# Patient Record
Sex: Female | Born: 1960
Health system: Southern US, Community
[De-identification: ages and names within clinical notes are randomized; demographics above are authoritative.]

## PROBLEM LIST (undated history)

## (undated) DIAGNOSIS — J45909 Unspecified asthma, uncomplicated: Secondary | ICD-10-CM

## (undated) DIAGNOSIS — R51 Headache: Secondary | ICD-10-CM

## (undated) DIAGNOSIS — I10 Essential (primary) hypertension: Secondary | ICD-10-CM

## (undated) DIAGNOSIS — K219 Gastro-esophageal reflux disease without esophagitis: Secondary | ICD-10-CM

## (undated) DIAGNOSIS — R519 Headache, unspecified: Secondary | ICD-10-CM

## (undated) HISTORY — DX: Unspecified asthma, uncomplicated: J45.909

## (undated) HISTORY — DX: Essential (primary) hypertension: I10

## (undated) HISTORY — DX: Gastro-esophageal reflux disease without esophagitis: K21.9

---

## 2009-08-05 ENCOUNTER — Emergency Department (HOSPITAL_COMMUNITY): Admission: EM | Admit: 2009-08-05 | Discharge: 2009-08-05 | Payer: Self-pay | Admitting: Family Medicine

## 2010-02-16 ENCOUNTER — Encounter: Admission: RE | Admit: 2010-02-16 | Discharge: 2010-02-16 | Payer: Self-pay | Admitting: Gastroenterology

## 2011-01-27 LAB — COMPREHENSIVE METABOLIC PANEL
Albumin: 4.2 g/dL (ref 3.5–5.2)
BUN: 6 mg/dL (ref 6–23)
Calcium: 9.7 mg/dL (ref 8.4–10.5)
Creatinine, Ser: 0.55 mg/dL (ref 0.4–1.2)
GFR calc Af Amer: >60 mL/min (ref >60)
Potassium: 4.1 mEq/L (ref 3.5–5.1)
Sodium: 139 mEq/L (ref 135–145)
Total Bilirubin: 0.4 mg/dL (ref 0.3–1.2)
Total Protein: 8.2 g/dL (ref 6.0–8.3)

## 2011-01-27 LAB — DIFFERENTIAL
Basophils Absolute: 0 10*3/uL (ref 0.0–0.1)
Monocytes Absolute: 0.6 10*3/uL (ref 0.1–1.0)
Monocytes Relative: 10 % (ref 3–12)
Neutrophils Relative %: 52 % (ref 43–77)

## 2011-01-27 LAB — POCT URINALYSIS DIP (DEVICE)
Ketones, ur: NEGATIVE mg/dL
Urobilinogen, UA: 0.2 mg/dL (ref 0.0–1.0)
pH: 6 (ref 5.0–8.0)

## 2011-01-27 LAB — CBC
MCHC: 34.7 g/dL (ref 30.0–36.0)
Platelets: 250 10*3/uL (ref 150–400)
RDW: 13.3 % (ref 11.5–15.5)

## 2011-01-27 LAB — POCT H PYLORI SCREEN: H. PYLORI SCREEN, POC: NEGATIVE

## 2011-01-27 LAB — POCT PREGNANCY, URINE: Preg Test, Ur: NEGATIVE

## 2012-05-05 ENCOUNTER — Emergency Department (HOSPITAL_COMMUNITY)
Admission: EM | Admit: 2012-05-05 | Discharge: 2012-05-05 | Disposition: A | Discharge status: Home / Self Care | Payer: BC Managed Care – PPO | Source: Home / Self Care | Attending: Emergency Medicine | Admitting: Emergency Medicine

## 2012-05-05 ENCOUNTER — Encounter (HOSPITAL_COMMUNITY): Payer: Self-pay | Admitting: Emergency Medicine

## 2012-05-05 DIAGNOSIS — R42 Dizziness and giddiness: Secondary | ICD-10-CM

## 2012-05-05 DIAGNOSIS — R51 Headache: Secondary | ICD-10-CM

## 2012-05-05 LAB — POCT I-STAT, CHEM 8
Calcium, Ion: 1.17 mmol/L (ref 1.12–1.23)
Chloride: 107 mEq/L (ref 96–112)
Glucose, Bld: 102 mg/dL — ABNORMAL HIGH (ref 70–99)
HCT: 47 % — ABNORMAL HIGH (ref 36.0–46.0)
Hemoglobin: 16 g/dL — ABNORMAL HIGH (ref 12.0–15.0)
Potassium: 4.3 mEq/L (ref 3.5–5.1)
Sodium: 142 mEq/L (ref 135–145)

## 2012-05-05 MED ORDER — ACETAMINOPHEN-CODEINE #3 300-30 MG PO TABS: 1.0000 | ORAL_TABLET | Freq: Four times a day (QID) | ORAL | Status: AC | PRN

## 2012-05-05 MED ORDER — ONDANSETRON 4 MG PO TBDP
4.0000 mg | ORAL_TABLET | Freq: Once | ORAL | Status: AC
  Administered 2012-05-05: 4 mg via ORAL

## 2012-05-05 MED ORDER — HYDROCODONE-ACETAMINOPHEN 5-325 MG PO TABS
1.0000 | ORAL_TABLET | Freq: Once | ORAL | Status: AC
  Administered 2012-05-05: 1 via ORAL

## 2012-05-05 MED ORDER — ONDANSETRON 4 MG PO TBDP
ORAL_TABLET | ORAL | Status: AC
  Filled 2012-05-05: qty 1

## 2012-05-05 MED ORDER — HYDROCODONE-ACETAMINOPHEN 5-325 MG PO TABS
ORAL_TABLET | ORAL | Status: AC
  Filled 2012-05-05: qty 1

## 2012-05-05 MED ORDER — ONDANSETRON HCL 4 MG PO TABS: 4.0000 mg | ORAL_TABLET | Freq: Four times a day (QID) | ORAL | Status: AC

## 2012-05-05 NOTE — ED Provider Notes (Signed)
History     CSN: [id number]  Arrival date & time 05/05/12  1236   First MD Initiated Contact with Patient 05/05/12 1243      Chief Complaint  Patient presents with  . Headache    (Consider location/radiation/quality/duration/timing/severity/associated sxs/prior treatment) HPI Comments: Patient presents urgent care today complaining of ongoing headache and dizziness, is not constant but they keep coming back. Sometimes she feels nauseous with the headaches and also feels that sometimes if she moves she gets more dizzy, or when she stands up. No vomiting, no fevers, no further symptoms and no constitutional symptoms such as fevers, arthralgias, myalgias, or unintentional weight loss. Patient also denies when asked about any numbness, tingling sensation weakness of upper lower extremities, visual changes, no ambulation or disequilibrium changes. Or facial expression changes. Patient is originally from did not and a relative is accompanying her to today's visit translating.  [place] 8 [place] denies having seen any provider is in the area or having a primary care doctor at this point.  Patient is a 51 y.o. female presenting with headaches. The history is provided by the patient. No language interpreter was used.  Headache The primary symptoms include headaches, dizziness and nausea. Primary symptoms do not include syncope, loss of consciousness, altered mental status, visual change, paresthesias, focal weakness, loss of sensation, speech change, memory loss, fever or vomiting. The symptoms began more than 1 week ago. The symptoms are worsening.  The headache is associated with photophobia. The headache is not associated with visual change, neck stiffness, paresthesias or weakness.  Dizziness also occurs with nausea. Dizziness does not occur with tinnitus, vomiting or weakness.  Additional symptoms include pain and photophobia. Additional symptoms do not  include neck stiffness, weakness or tinnitus.    History reviewed. No pertinent past medical history.  History reviewed. No pertinent past surgical history.  No family history on file.  History  Substance Use Topics  . Smoking status: Never Smoker   . Smokeless tobacco: Not on file  . Alcohol Use: No    OB History    Grav Para Term Preterm Abortions TAB SAB Ect Mult Living                  Review of Systems  Constitutional: Negative for fever, activity change and appetite change.  HENT: Negative for neck stiffness and tinnitus.   Eyes: Positive for photophobia.  Respiratory: Negative for cough and shortness of breath.   Cardiovascular: Negative for chest pain and syncope.  Gastrointestinal: Positive for nausea. Negative for vomiting and abdominal pain.  Skin: Negative for pallor and rash.  Neurological: Positive for dizziness and headaches. Negative for speech change, focal weakness, loss of consciousness, facial asymmetry, weakness, light-headedness, numbness and paresthesias.  Psychiatric/Behavioral: Negative for memory loss and altered mental status.    Allergies  Review of patient's allergies indicates no known allergies.  Home Medications   Current Outpatient Rx  Name Route Sig Dispense Refill  . OVER THE COUNTER MEDICATION  Extra strength migraine medication      BP 135/89  Pulse 60  Temp 98 F (36.7 C) (Oral)  Resp 18  SpO2 98%  LMP 04/23/2012  Physical Exam  Nursing note and vitals reviewed. Constitutional: She is oriented to person, place, and time. She appears well-developed and well-nourished.  HENT:  Head: Normocephalic.  Eyes: Conjunctivae and EOM are normal. Pupils are equal, round, and reactive to light.  Neck: Neck supple. No JVD  present. No Brudzinski's sign and no Kernig's sign noted.  Cardiovascular: Normal rate.   Pulmonary/Chest: Effort normal and breath sounds normal.  Neurological: She is alert and oriented to person, place, and  time. She displays no atrophy, no tremor and normal reflexes. No cranial nerve deficit or sensory deficit. She exhibits normal muscle tone. She displays a negative Romberg sign. Coordination and gait normal.  Skin: Skin is warm. No erythema.    ED Course  Procedures (including critical care time)  Labs Reviewed  POCT I-STAT, CHEM 8 - Abnormal; Notable for the following:    Glucose, Bld 102 (*)     Hemoglobin 16.0 (*)     HCT 47.0 (*)     All other components within normal limits   No results found.   No diagnosis found.    MDM  Recurrent HA and dizziness x 1 month. Normal neurological exam- Patient reports nausea with HA (intermitent), takes something OTC         Jimmie Molly, MD 05/05/12 1442

## 2012-05-05 NOTE — ED Notes (Signed)
Headache and dizziness, onset 7/12.  Reports dizziness regardless of position.  Nausea this am, no vomiting

## 2012-06-21 ENCOUNTER — Ambulatory Visit (INDEPENDENT_AMBULATORY_CARE_PROVIDER_SITE_OTHER): Payer: BC Managed Care – PPO | Admitting: Internal Medicine

## 2012-06-21 ENCOUNTER — Encounter: Payer: Self-pay | Admitting: Internal Medicine

## 2012-06-21 VITALS — BP 124/82 | HR 68 | Temp 98.0°F | Ht 60.5 in | Wt 140.0 lb

## 2012-06-21 DIAGNOSIS — R519 Headache, unspecified: Secondary | ICD-10-CM | POA: Insufficient documentation

## 2012-06-21 DIAGNOSIS — R1013 Epigastric pain: Secondary | ICD-10-CM

## 2012-06-21 DIAGNOSIS — Z0000 Encounter for general adult medical examination without abnormal findings: Secondary | ICD-10-CM

## 2012-06-21 DIAGNOSIS — R42 Dizziness and giddiness: Secondary | ICD-10-CM

## 2012-06-21 DIAGNOSIS — R51 Headache: Secondary | ICD-10-CM

## 2012-06-21 LAB — CBC WITH DIFFERENTIAL/PLATELET
Basophils Relative: 0.4 % (ref 0.0–3.0)
Eosinophils Absolute: 0.2 10*3/uL (ref 0.0–0.7)
Eosinophils Relative: 1.9 % (ref 0.0–5.0)
HCT: 44.5 % (ref 36.0–46.0)
Lymphs Abs: 2.9 10*3/uL (ref 0.7–4.0)
MCV: 82.8 fl (ref 78.0–100.0)
Platelets: 247 10*3/uL (ref 150.0–400.0)
RBC: 5.37 Mil/uL — ABNORMAL HIGH (ref 3.87–5.11)
RDW: 13.3 % (ref 11.5–14.6)

## 2012-06-21 LAB — BASIC METABOLIC PANEL
BUN: 10 mg/dL (ref 6–23)
CO2: 24 mEq/L (ref 19–32)
Calcium: 9.2 mg/dL (ref 8.4–10.5)
Potassium: 3.9 mEq/L (ref 3.5–5.1)

## 2012-06-21 LAB — LIPID PANEL: Triglycerides: 105 mg/dL (ref 0.0–149.0)

## 2012-06-21 LAB — TSH: TSH: 0.77 u[IU]/mL (ref 0.35–5.50)

## 2012-06-21 LAB — HEPATIC FUNCTION PANEL
AST: 27 U/L (ref 0–37)
Total Protein: 8 g/dL (ref 6.0–8.3)

## 2012-06-21 LAB — LDL CHOLESTEROL, DIRECT: Direct LDL: 146.7 mg/dL

## 2012-06-21 MED ORDER — OMEPRAZOLE 20 MG PO CPDR: 20.0000 mg | DELAYED_RELEASE_CAPSULE | Freq: Every day | ORAL | Status: DC

## 2012-06-21 MED ORDER — TRAMADOL HCL 50 MG PO TABS: 50.0000 mg | ORAL_TABLET | Freq: Two times a day (BID) | ORAL | Status: AC | PRN

## 2012-06-21 NOTE — Assessment & Plan Note (Signed)
She has intermittent epigastric pain. Patient reports remote EGD. She has no weight loss or dysphagia. Start Prilosec 20 mg once daily. If persistent symptoms consider referral to GI for repeat EGD.

## 2012-06-21 NOTE — Progress Notes (Signed)
Subjective:    Patient ID: Denise Blackburn, female    DOB: February 07, 1961, 51 y.o.   MRN: [id number]  HPI  51 year old [place] female to establish. Patient complains of chronic severe headaches for the last 5 months. Patient does not speak [place]. Her daughter Denise Blackburn is acting as Nurse, learning disability.  Patient describes the pain predominantly in the left side of her head. She has associated dizziness and intermittent vomiting. She rates severity of pain is 9/10. Her symptoms are worse when she lays down. She has previously been to urgent care and prescribed pain medications. She has also taken some over-the-counter pain medications with some improvement but her headaches always come back.  She denies any ear fullness or hearing loss. She occasionally has watery eyes but no changes in her vision. Patient reports occasionally her whole body will feel numb.  Patient's only medical history is of abdominal pain in the past. She was seen by a physician when she lived in [place]. Her daughter reports an endoscopy was performed 17 years ago. She does not recall results. She has intermittent epigastric pain and bitter taste in her mouth. She denies significant weight change. She denies dysphasia.  Review of Systems   Constitutional: Negative for activity change, appetite change and unexpected weight change.  Eyes: Negative for visual disturbance.  Respiratory: Negative for cough, chest tightness and shortness of breath.   Cardiovascular: Negative for chest pain.  Genitourinary: Negative for difficulty urinating.  Neurological: See HIP  Gastrointestinal: occasional epigastric abdominal pain Psych: Negative for depression or anxiety  Past Medical History  Diagnosis Date  . Asthma     7 yrs ago.  She does not use any inhalers  . GERD (gastroesophageal reflux disease)     EGD performed 15-17 yrs ago in [place]    History   Social History  . Marital Status: Married    Spouse Name: N/A    Number of Children:  N/A  . Years of Education: N/A   Occupational History  . Not on file.   Social History Main Topics  . Smoking status: Never Smoker   . Smokeless tobacco: Not on file  . Alcohol Use: No  . Drug Use: No  . Sexually Active:    Other Topics Concern  . 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  No family history on file.  No Known Allergies  Current Outpatient Prescriptions on File Prior to Visit  Medication Sig Dispense Refill  . omeprazole (PRILOSEC) 20 MG capsule Take 1 capsule (20 mg total) by mouth daily.  30 capsule  3    BP 124/82  Pulse 68  Temp 98 F (36.7 C) (Oral)  Ht 5' 0.5" (1.537 m)  Wt 140 lb (63.504 kg)  BMI 26.89 kg/m2     Objective:   Physical Exam  Constitutional: She is oriented to person, place, and time. She appears well-developed and well-nourished. No distress.  HENT:  Head: Normocephalic and atraumatic.  Right Ear: External ear normal.  Left Ear: External ear normal.  Mouth/Throat: Oropharynx is clear and moist.  Eyes: EOM are normal. Pupils are equal, round, and reactive to light. No scleral icterus.       No defects in peripheral vision  Neck: Neck supple.       No carotid bruit  Cardiovascular: Normal rate, regular rhythm and normal heart sounds.   Pulmonary/Chest: Effort normal and breath sounds normal. She  has no wheezes.  Abdominal: Soft. She exhibits no distension and no mass. There is no rebound.       Mild epigastric tenderness  Musculoskeletal: Normal range of motion. She exhibits no edema.  Lymphadenopathy:    She has no cervical adenopathy.  Neurological: She is oriented to person, place, and time. She has normal reflexes. No cranial nerve deficit.       Negative for pronator drift, negative Romberg Her gait is normal Negative cerebellar signs  Skin: Skin is warm and dry.  Psychiatric: She has a normal mood and affect. Her behavior is normal.        Assessment & Plan:

## 2012-06-21 NOTE — Patient Instructions (Addendum)
Ok to take tylenol 325 mg along with tramadol 50 mg for severe headaches

## 2012-06-21 NOTE — Assessment & Plan Note (Addendum)
51 year old Asian female with severe new onset left-sided headache for last 5 months. She has associated nausea and dizziness. Obtain MRI of brain without contrast to rule out intracranial lesion. Use tramadol along with acetaminophen for pain control for now.

## 2012-06-22 NOTE — Progress Notes (Signed)
Quick Note:  Left a message for pt to return call. ______ 

## 2012-06-22 NOTE — Addendum Note (Signed)
Addended by: Meda Coffee on: 06/22/2012 12:31 PM   Modules accepted: Orders

## 2012-06-27 ENCOUNTER — Other Ambulatory Visit: Payer: Self-pay | Admitting: Internal Medicine

## 2012-06-27 DIAGNOSIS — R7989 Other specified abnormal findings of blood chemistry: Secondary | ICD-10-CM

## 2012-06-27 NOTE — Progress Notes (Signed)
Quick Note:  Called and spoke with pt and pt is aware. Pt has an appt set for 06/28/12 for labs. ______

## 2012-06-28 ENCOUNTER — Other Ambulatory Visit: Payer: BC Managed Care – PPO

## 2012-06-28 DIAGNOSIS — R7989 Other specified abnormal findings of blood chemistry: Secondary | ICD-10-CM

## 2012-06-29 ENCOUNTER — Ambulatory Visit
Admission: RE | Admit: 2012-06-29 | Discharge: 2012-06-29 | Disposition: A | Discharge status: Home / Self Care | Payer: BC Managed Care – PPO | Source: Ambulatory Visit | Attending: Internal Medicine | Admitting: Internal Medicine

## 2012-06-29 DIAGNOSIS — R519 Headache, unspecified: Secondary | ICD-10-CM

## 2012-06-29 DIAGNOSIS — R42 Dizziness and giddiness: Secondary | ICD-10-CM

## 2012-06-29 LAB — HEPATITIS C ANTIBODY: HCV Ab: NEGATIVE

## 2012-06-29 LAB — HEPATITIS B SURFACE ANTIBODY, QUANTITATIVE: Hepatitis B-Post: 53.7 m[IU]/mL

## 2012-06-29 MED ORDER — GADOBENATE DIMEGLUMINE 529 MG/ML IV SOLN
13.0000 mL | Freq: Once | INTRAVENOUS | Status: AC | PRN
  Administered 2012-06-29: 13 mL via INTRAVENOUS

## 2012-07-02 ENCOUNTER — Ambulatory Visit: Payer: Self-pay | Admitting: Family

## 2012-07-05 ENCOUNTER — Encounter: Payer: Self-pay | Admitting: Internal Medicine

## 2012-07-05 ENCOUNTER — Ambulatory Visit (INDEPENDENT_AMBULATORY_CARE_PROVIDER_SITE_OTHER): Payer: BC Managed Care – PPO | Admitting: Internal Medicine

## 2012-07-05 VITALS — BP 122/80 | Temp 97.9°F | Wt 140.0 lb

## 2012-07-05 DIAGNOSIS — R51 Headache: Secondary | ICD-10-CM

## 2012-07-05 DIAGNOSIS — R519 Headache, unspecified: Secondary | ICD-10-CM

## 2012-07-05 DIAGNOSIS — R7401 Elevation of levels of liver transaminase levels: Secondary | ICD-10-CM

## 2012-07-05 DIAGNOSIS — R1013 Epigastric pain: Secondary | ICD-10-CM

## 2012-07-05 MED ORDER — HYDROCODONE-ACETAMINOPHEN 5-500 MG PO TABS: ORAL_TABLET | ORAL | Status: DC

## 2012-07-05 MED ORDER — AMITRIPTYLINE HCL 10 MG PO TABS: 5.0000 mg | ORAL_TABLET | Freq: Every day | ORAL | Status: DC

## 2012-07-05 MED ORDER — CEFUROXIME AXETIL 500 MG PO TABS: 500.0000 mg | ORAL_TABLET | Freq: Two times a day (BID) | ORAL | Status: AC

## 2012-07-05 NOTE — Assessment & Plan Note (Addendum)
She has mild elevation in ALT.  She is negative for chronic Hep C or Hep B.  Mild elevation may be secondary to OTC NSAID use.  Lab Results  Component Value Date   ALT 37* 06/21/2012   AST 27 06/21/2012   ALKPHOS 58 06/21/2012   BILITOT 0.4 06/21/2012

## 2012-07-05 NOTE — Assessment & Plan Note (Signed)
Improved with omeprazole.  Check H. Pylori titer with next lab draw.

## 2012-07-05 NOTE — Assessment & Plan Note (Signed)
MRI of brain is negative for intracranial mass.  She has left sided sinus disease.  Trial of ceftin 500 mg bid x 10 days.  Also try low dose amitriptyline at bedtime.  She has persistent dizziness.  Question vestibular dysfunction.  Consider referral to ENT.

## 2012-07-05 NOTE — Progress Notes (Signed)
  Subjective:    Patient ID: Denise Blackburn, female    DOB: 1961-06-07, 51 y.o.   MRN: [id number]  HPI  51 y/o Asian female for follow up re: headache and dizziness.  Patient headaches slightly improved.  Her dizziness improves with laying down.  Tramadol helpful but causes pruritus.  MRI of Brain reviewed in detail with patient and daughter.  MRI of brain negative for intracranial mass but shows left sided sinus disease.  Her headaches are left sided.  Her abdominal complaints improved with starting omeprazole.  She denies dysphagia.   Review of Systems Dizziness, no hearing loss  Past Medical History  Diagnosis Date  . Asthma     7 yrs ago.  She does not use any inhalers  . GERD (gastroesophageal reflux disease)     EGD performed 15-17 yrs ago in [place]    History   Social History  . Marital Status: Married    Spouse Name: N/A    Number of Children: N/A  . Years of Education: N/A   Occupational History  . Not on file.   Social History Main Topics  . Smoking status: Never Smoker   . Smokeless tobacco: Not on file  . Alcohol Use: No  . Drug Use: No  . Sexually Active:    Other Topics Concern  . 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  No family history on file.  No Known Allergies  Current Outpatient Prescriptions on File Prior to Visit  Medication Sig Dispense Refill  . omeprazole (PRILOSEC) 20 MG capsule Take 1 capsule (20 mg total) by mouth daily.  30 capsule  3  . amitriptyline (ELAVIL) 10 MG tablet Take 0.5 tablets (5 mg total) by mouth at bedtime.  30 tablet  2    BP 122/80  Temp 97.9 F (36.6 C) (Oral)  Wt 140 lb (63.504 kg)       Objective:   Physical Exam  Constitutional: She is oriented to person, place, and time. She appears well-developed and well-nourished.  HENT:  Head: Normocephalic and atraumatic.  Right Ear: External ear normal.  Left Ear: External ear  normal.  Neck: Neck supple.  Cardiovascular: Normal rate, regular rhythm and normal heart sounds.   No murmur heard. Pulmonary/Chest: Effort normal and breath sounds normal.  Lymphadenopathy:    She has no cervical adenopathy.  Neurological: She is alert and oriented to person, place, and time. No cranial nerve deficit.          Assessment & Plan:

## 2012-08-02 ENCOUNTER — Encounter: Payer: Self-pay | Admitting: Internal Medicine

## 2012-08-02 ENCOUNTER — Ambulatory Visit (INDEPENDENT_AMBULATORY_CARE_PROVIDER_SITE_OTHER): Payer: BC Managed Care – PPO | Admitting: Internal Medicine

## 2012-08-02 VITALS — BP 104/76 | HR 72 | Temp 98.4°F | Wt 142.0 lb

## 2012-08-02 DIAGNOSIS — R519 Headache, unspecified: Secondary | ICD-10-CM

## 2012-08-02 DIAGNOSIS — R42 Dizziness and giddiness: Secondary | ICD-10-CM | POA: Insufficient documentation

## 2012-08-02 DIAGNOSIS — R51 Headache: Secondary | ICD-10-CM

## 2012-08-02 MED ORDER — AMITRIPTYLINE HCL 10 MG PO TABS: 20.0000 mg | ORAL_TABLET | Freq: Every day | ORAL | Status: DC

## 2012-08-02 MED ORDER — DIAZEPAM 2 MG PO TABS: 2.0000 mg | ORAL_TABLET | Freq: Two times a day (BID) | ORAL | Status: DC | PRN

## 2012-08-02 NOTE — Progress Notes (Signed)
  Subjective:    Patient ID: Denise Blackburn, female    DOB: [birth year], 51 y.o.   MRN: [id number]  HPI  51 year old [place] female for follow up regarding headache and dizziness. At previous visit patient started on amitriptyline 10 mg at bedtime. She was also started on cefuroxime for possible left-sided sinus disease.  Patient is accompanied by her daughter who is translating for her. Her headache severity has improved but not resolved. She also continues to have symptoms of vertigo especially when she is laying down.   Review of Systems No ear pain  Past Medical History  Diagnosis Date  . Asthma     7 yrs ago.  She does not use any inhalers  . GERD (gastroesophageal reflux disease)     EGD performed 15-17 yrs ago in [place]    History   Social History  . Marital Status: Married    Spouse Name: N/A    Number of Children: N/A  . Years of Education: N/A   Occupational History  . Not on file.   Social History Main Topics  . Smoking status: Never Smoker   . Smokeless tobacco: Not on file  . Alcohol Use: No  . Drug Use: No  . Sexually Active:    Other Topics Concern  . 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  No family history on file.  No Known Allergies  Current Outpatient Prescriptions on File Prior to Visit  Medication Sig Dispense Refill  . omeprazole (PRILOSEC) 20 MG capsule Take 1 capsule (20 mg total) by mouth daily.  30 capsule  3  . DISCONTD: amitriptyline (ELAVIL) 10 MG tablet Take 0.5 tablets (5 mg total) by mouth at bedtime.  30 tablet  2    BP 104/76  Pulse 72  Temp 98.4 F (36.9 C) (Oral)  Wt 142 lb (64.411 kg)       Objective:   Physical Exam  Constitutional: She is oriented to person, place, and time. She appears well-developed and well-nourished.  HENT:  Head: Normocephalic and atraumatic.  Right Ear: External ear normal.  Left Ear: External ear normal.    Mouth/Throat: Oropharynx is clear and moist.  Cardiovascular: Normal rate, regular rhythm and normal heart sounds.   No murmur heard. Pulmonary/Chest: Effort normal and breath sounds normal.  Neurological: She is alert and oriented to person, place, and time. No cranial nerve deficit. She exhibits normal muscle tone. Coordination normal.          Assessment & Plan:

## 2012-08-02 NOTE — Assessment & Plan Note (Signed)
Patient complains of persistent vertigo especially when she is laying down. Symptoms may be secondary to benign positional vertigo. Her MRI was negative for cerebellar lesion. Refer to ENT for further evaluation and treatment. Patient may benefit from vestibular rehabilitation. Patient advised to use diazepam 2 mg one half to one tablet twice daily as needed.

## 2012-08-02 NOTE — Assessment & Plan Note (Signed)
Mild improvement in headache severity with amitriptyline 10 mg. Titrate to 20 mg at bedtime.

## 2012-09-19 ENCOUNTER — Ambulatory Visit: Payer: Self-pay | Admitting: Internal Medicine

## 2013-04-04 ENCOUNTER — Ambulatory Visit: Payer: BC Managed Care – PPO | Admitting: Internal Medicine

## 2013-04-05 ENCOUNTER — Encounter: Payer: Self-pay | Admitting: Internal Medicine

## 2013-04-05 ENCOUNTER — Ambulatory Visit (INDEPENDENT_AMBULATORY_CARE_PROVIDER_SITE_OTHER): Payer: BC Managed Care – PPO | Admitting: Internal Medicine

## 2013-04-05 VITALS — BP 110/80 | Temp 98.1°F | Wt 148.0 lb

## 2013-04-05 DIAGNOSIS — R51 Headache: Secondary | ICD-10-CM

## 2013-04-05 DIAGNOSIS — R519 Headache, unspecified: Secondary | ICD-10-CM

## 2013-04-05 MED ORDER — HYDROCODONE-ACETAMINOPHEN 5-325 MG PO TABS: ORAL_TABLET | ORAL | Status: DC

## 2013-04-05 NOTE — Progress Notes (Signed)
  Subjective:    Patient ID: Denise Blackburn, female    DOB: 05-21-1961, 52 y.o.   MRN: [id number]  HPI  52 year old hematemesis female previously seen for headache and dizziness for followup. It has been since 08/02/2012 since her last office visit. Patient discontinued amitriptyline. Patient reports it wasn't helping much with her headache. Her vertigo symptoms have improved. However her headaches have gotten worse. She is accompanied by her supportive daughter who is acting as Nurse, learning disability. She rates her headache as 10 out of 10. She complains of "beating/pulsing sensation" associated with her headache. She does not have any associated nausea or photophobia.  She denies family hx of aneurysms  Review of Systems Negative for nausea or photophobia.  She denies changes in her vision    Past Medical History  Diagnosis Date  . Asthma     7 yrs ago.  She does not use any inhalers  . GERD (gastroesophageal reflux disease)     EGD performed 15-17 yrs ago in [place]    History   Social History  . Marital Status: Married    Spouse Name: N/A    Number of Children: N/A  . Years of Education: N/A   Occupational History  . Not on file.   Social History Main Topics  . Smoking status: Never Smoker   . Smokeless tobacco: Not on file  . Alcohol Use: No  . Drug Use: No  . Sexually Active:    Other Topics Concern  . 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  No family history on file.  No Known Allergies  Current Outpatient Prescriptions on File Prior to Visit  Medication Sig Dispense Refill  . amitriptyline (ELAVIL) 10 MG tablet Take 2 tablets (20 mg total) by mouth at bedtime.  60 tablet  2  . diazepam (VALIUM) 2 MG tablet Take 1 tablet (2 mg total) by mouth every 12 (twelve) hours as needed. For dizziness  30 tablet  0  . omeprazole (PRILOSEC) 20 MG capsule Take 1 capsule (20 mg total) by mouth daily.   30 capsule  3   No current facility-administered medications on file prior to visit.    BP 110/80  Temp(Src) 98.1 F (36.7 C) (Oral)  Wt 148 lb (67.132 kg)  BMI 28.42 kg/m2    Objective:   Physical Exam  Constitutional: She is oriented to person, place, and time. She appears well-developed.  HENT:  Head: Normocephalic and atraumatic.  Right Ear: External ear normal.  Left Ear: External ear normal.  Eyes: EOM are normal. Pupils are equal, round, and reactive to light.  Neck:  No pain with cervical ROM  Cardiovascular: Normal rate, regular rhythm and normal heart sounds.   Pulmonary/Chest: Effort normal and breath sounds normal. She has no wheezes.  Musculoskeletal: She exhibits no edema.  Neurological: She is alert and oriented to person, place, and time. She displays normal reflexes. No cranial nerve deficit. She exhibits normal muscle tone.  Negative for pronator drift  Skin: Skin is warm and dry.  Psychiatric: She has a normal mood and affect. Her behavior is normal.          Assessment & Plan:

## 2013-04-05 NOTE — Assessment & Plan Note (Signed)
52 year old Asian female with worsening headache. She describes pulsatile nature/sensation. Obtain CT angiogram of head to rule out aneurysm. Her symptoms not consistent migraine headache. Trial of hydrocodone/ APAP.  Arrange followup with her headache specialist-Dr. Vela Prose.

## 2013-04-08 ENCOUNTER — Ambulatory Visit: Payer: BC Managed Care – PPO | Admitting: Family Medicine

## 2013-04-08 ENCOUNTER — Telehealth: Payer: Self-pay | Admitting: Internal Medicine

## 2013-04-08 NOTE — Telephone Encounter (Signed)
Seen where rx was printed and called pharmacy

## 2013-04-08 NOTE — Telephone Encounter (Signed)
Pt gave rx for HYDROcodone-acetaminophen (NORCO/VICODIN) 5-325 MG per tablet to [facility] on [address], [place] states doctor did not sign, so they need verification from office to fill. Pt stating we have not yet done so. Please call pharmacy.

## 2013-04-11 ENCOUNTER — Other Ambulatory Visit: Payer: BC Managed Care – PPO

## 2013-04-15 ENCOUNTER — Ambulatory Visit (INDEPENDENT_AMBULATORY_CARE_PROVIDER_SITE_OTHER)
Admission: RE | Admit: 2013-04-15 | Discharge: 2013-04-15 | Disposition: A | Discharge status: Home / Self Care | Payer: BC Managed Care – PPO | Source: Ambulatory Visit | Attending: Internal Medicine | Admitting: Internal Medicine

## 2013-04-15 DIAGNOSIS — R51 Headache: Secondary | ICD-10-CM

## 2013-04-15 DIAGNOSIS — R519 Headache, unspecified: Secondary | ICD-10-CM

## 2013-04-15 MED ORDER — IOHEXOL 350 MG/ML SOLN
80.0000 mL | Freq: Once | INTRAVENOUS | Status: AC | PRN
  Administered 2013-04-15: 80 mL via INTRAVENOUS

## 2015-03-16 ENCOUNTER — Emergency Department (HOSPITAL_COMMUNITY)
Admission: EM | Admit: 2015-03-16 | Discharge: 2015-03-16 | Disposition: A | Discharge status: Home / Self Care | Payer: Self-pay

## 2015-03-16 ENCOUNTER — Emergency Department (HOSPITAL_COMMUNITY)
Admission: EM | Admit: 2015-03-16 | Discharge: 2015-03-16 | Disposition: A | Discharge status: Home / Self Care | Payer: BLUE CROSS/BLUE SHIELD | Attending: Emergency Medicine | Admitting: Emergency Medicine

## 2015-03-16 ENCOUNTER — Encounter (HOSPITAL_COMMUNITY): Payer: Self-pay | Admitting: *Deleted

## 2015-03-16 DIAGNOSIS — R51 Headache: Secondary | ICD-10-CM | POA: Insufficient documentation

## 2015-03-16 DIAGNOSIS — R112 Nausea with vomiting, unspecified: Secondary | ICD-10-CM | POA: Diagnosis not present

## 2015-03-16 DIAGNOSIS — Z8719 Personal history of other diseases of the digestive system: Secondary | ICD-10-CM | POA: Insufficient documentation

## 2015-03-16 DIAGNOSIS — Z3202 Encounter for pregnancy test, result negative: Secondary | ICD-10-CM | POA: Diagnosis not present

## 2015-03-16 DIAGNOSIS — H539 Unspecified visual disturbance: Secondary | ICD-10-CM | POA: Diagnosis not present

## 2015-03-16 DIAGNOSIS — J45909 Unspecified asthma, uncomplicated: Secondary | ICD-10-CM | POA: Insufficient documentation

## 2015-03-16 DIAGNOSIS — R519 Headache, unspecified: Secondary | ICD-10-CM

## 2015-03-16 DIAGNOSIS — H53149 Visual discomfort, unspecified: Secondary | ICD-10-CM | POA: Diagnosis not present

## 2015-03-16 LAB — BASIC METABOLIC PANEL
ANION GAP: 12 (ref 5–15)
BUN: 10 mg/dL (ref 6–20)
CALCIUM: 9.9 mg/dL (ref 8.9–10.3)
CO2: 23 mmol/L (ref 22–32)
Chloride: 106 mmol/L (ref 101–111)
Creatinine, Ser: 0.59 mg/dL (ref 0.44–1.00)
GLUCOSE: 96 mg/dL (ref 65–99)
POTASSIUM: 4.3 mmol/L (ref 3.5–5.1)
Sodium: 141 mmol/L (ref 135–145)

## 2015-03-16 LAB — CBC WITH DIFFERENTIAL/PLATELET
BASOS ABS: 0.1 10*3/uL (ref 0.0–0.1)
Basophils Relative: 0 % (ref 0–1)
Eosinophils Absolute: 0 10*3/uL (ref 0.0–0.7)
Eosinophils Relative: 0 % (ref 0–5)
HEMATOCRIT: 46.8 % — AB (ref 36.0–46.0)
Hemoglobin: 15.9 g/dL — ABNORMAL HIGH (ref 12.0–15.0)
Lymphocytes Relative: 20 % (ref 12–46)
Lymphs Abs: 2.6 10*3/uL (ref 0.7–4.0)
MCH: 27.6 pg (ref 26.0–34.0)
MCHC: 34 g/dL (ref 30.0–36.0)
MCV: 81.3 fL (ref 78.0–100.0)
MONO ABS: 0.7 10*3/uL (ref 0.1–1.0)
MONOS PCT: 5 % (ref 3–12)
NEUTROS PCT: 75 % (ref 43–77)
Neutro Abs: 9.4 10*3/uL — ABNORMAL HIGH (ref 1.7–7.7)
PLATELETS: 284 10*3/uL (ref 150–400)
RBC: 5.76 MIL/uL — ABNORMAL HIGH (ref 3.87–5.11)
RDW: 13.7 % (ref 11.5–15.5)
WBC: 12.7 10*3/uL — AB (ref 4.0–10.5)

## 2015-03-16 LAB — POC URINE PREG, ED: Preg Test, Ur: NEGATIVE

## 2015-03-16 MED ORDER — PROCHLORPERAZINE EDISYLATE 5 MG/ML IJ SOLN
10.0000 mg | Freq: Once | INTRAMUSCULAR | Status: AC
  Administered 2015-03-16: 10 mg via INTRAVENOUS
  Filled 2015-03-16: qty 2

## 2015-03-16 MED ORDER — SODIUM CHLORIDE 0.9 % IV BOLUS (SEPSIS)
1000.0000 mL | Freq: Once | INTRAVENOUS | Status: AC
  Administered 2015-03-16: 1000 mL via INTRAVENOUS

## 2015-03-16 MED ORDER — KETOROLAC TROMETHAMINE 30 MG/ML IJ SOLN
30.0000 mg | Freq: Once | INTRAMUSCULAR | Status: AC
  Administered 2015-03-16: 30 mg via INTRAVENOUS
  Filled 2015-03-16: qty 1

## 2015-03-16 NOTE — ED Provider Notes (Signed)
CSN: [id number]     Arrival date & time 03/16/15  1335 History   First MD Initiated Contact with Patient 03/16/15 1628     Chief Complaint  Patient presents with  . Headache     (Consider location/radiation/quality/duration/timing/severity/associated sxs/prior Treatment) HPI Denise Blackburn is a 54 year old female who is brought into the ER with her family members, daughter is at bedside translating for patient as she speaks [place] only. Patient complains of a persistent headache which has been present over the past 2 years. Patient reports worsening of her headache throughout the day time, and states that it is a daily occurrence. Patient states she typically takes over-the-counter medicine such as Aleve to help her headaches. Patient reports that typically Aleve will help her headache subside, however today her headache is worse than it usually is. Patient states that she was unable to control her headache with over-the-counter medications today. Patient states she was seeing a primary care provider approximately 2 years ago for these headaches, and underwent several imaging modalities which were negative for acute pathology. Patient was given several courses of medication regimens which she reports helping some, however never followed up with his primary care physician after 2 years ago. Patient does not have a specific reason for not following up. Patient reports some throbbing sensation throughout her entire head and neck, mild photophobia and phonophobia, nausea and vomiting, and occasional visual disturbances. She states her headache feels consistent with an identical to the headaches she has had over the past 2 years, however today the intensity was worse than usual.  Past Medical History  Diagnosis Date  . Asthma     7 yrs ago.  She does not use any inhalers  . GERD (gastroesophageal reflux disease)     EGD performed 15-17 yrs ago in [place]   History reviewed. No pertinent past surgical  history. No family history on file. History  Substance Use Topics  . Smoking status: Never Smoker   . Smokeless tobacco: Not on file  . Alcohol Use: No   OB History    No data available     Review of Systems  Constitutional: Negative for fever.  HENT: Negative for trouble swallowing.   Eyes: Positive for photophobia and visual disturbance.  Respiratory: Negative for shortness of breath.   Cardiovascular: Negative for chest pain.  Gastrointestinal: Positive for nausea and vomiting. Negative for abdominal pain.  Genitourinary: Negative for dysuria.  Musculoskeletal: Negative for neck pain.  Skin: Negative for rash.  Neurological: Positive for light-headedness and headaches. Negative for dizziness, weakness and numbness.  Psychiatric/Behavioral: Negative.       Allergies  Review of patient's allergies indicates no known allergies.  Home Medications   Prior to Admission medications   Medication Sig Start Date End Date Taking? Authorizing Provider  naproxen sodium (ANAPROX) 220 MG tablet Take 220-440 mg by mouth every 12 (twelve) hours as needed (pain).   Yes Historical Provider, MD  HYDROcodone-acetaminophen (NORCO/VICODIN) 5-325 MG per tablet 1/2 to 1 tablet Patient not taking: Reported on 03/16/2015 04/05/13   Doe-Hyun R Shawna Orleans, DO   BP 145/93 mmHg  Pulse 96  Temp(Src) 98.3 F (36.8 C) (Oral)  Resp 16  SpO2 96%  LMP 03/09/2015 Physical Exam  Constitutional: She is oriented to person, place, and time. She appears well-developed and well-nourished. No distress.  HENT:  Head: Normocephalic and atraumatic.  Mouth/Throat: Oropharynx is clear and moist. No oropharyngeal exudate.  Eyes: EOM are normal. Pupils are equal, round, and reactive to  light. Right eye exhibits no discharge. Left eye exhibits no discharge. No scleral icterus.  Neck: Normal range of motion and full passive range of motion without pain. Neck supple. No spinous process tenderness and no muscular tenderness  present. No rigidity. No edema, no erythema and normal range of motion present. No Brudzinski's sign and no Kernig's sign noted.  Cardiovascular: Normal rate, regular rhythm and normal heart sounds.   No murmur heard. Pulmonary/Chest: Effort normal and breath sounds normal. No respiratory distress.  Abdominal: Soft. Normal appearance. There is no tenderness. There is no rigidity, no guarding, no tenderness at McBurney's point and negative Murphy's sign.  Musculoskeletal: Normal range of motion. She exhibits no edema or tenderness.  Neurological: She is alert and oriented to person, place, and time. She has normal strength. No cranial nerve deficit or sensory deficit. She displays a negative Romberg sign. Coordination and gait normal. GCS eye subscore is 4. GCS verbal subscore is 5. GCS motor subscore is 6.  Patient fully alert, answering questions appropriately in full, clear sentences. Cranial nerves II through XII grossly intact. Motor strength 5 out of 5 in all major muscle groups of upper and lower extremities. Distal sensation intact.   Skin: Skin is warm and dry. No rash noted. She is not diaphoretic.  Psychiatric: She has a normal mood and affect.  Nursing note and vitals reviewed.   ED Course  Procedures (including critical care time) Labs Review Labs Reviewed  CBC WITH DIFFERENTIAL/PLATELET - Abnormal; Notable for the following:    WBC 12.7 (*)    RBC 5.76 (*)    Hemoglobin 15.9 (*)    HCT 46.8 (*)    Neutro Abs 9.4 (*)    All other components within normal limits  BASIC METABOLIC PANEL  POC URINE PREG, ED    Imaging Review No results found.   EKG Interpretation None      MDM   Final diagnoses:  Bad headache    Patient here with headache persisted over the past 2 years. At the onset of patient's headache through her primary care provider patient has had extensive imaging of her brain with MRI and CT angios of her head. Patient reports these are the same headaches  today, consistent with an identical to previous headaches. She reports she's been unable to control her headache with over-the-counter medication. Pt HA treated and improved while in ED.  Presentation is like pts typical HA and non concerning for [facility], ICH, Meningitis, or temporal arteritis. Pt is afebrile with no focal neuro deficits, nuchal rigidity, or change in vision. Pt is to follow up with PCP to discuss prophylactic medication. Afebrile, hemodynamically stable and in no acute distress. Patient stable for discharge. We'll provide patient with outpatient follow-up to neurology for further evaluation of her headaches. Return precautions discussed, patient verbalizes understanding and agreement of this plan.  BP 145/93 mmHg  Pulse 96  Temp(Src) 98.3 F (36.8 C) (Oral)  Resp 16  SpO2 96%  LMP 03/09/2015  Signed,  Dahlia Bailiff, PA-C 12:53 AM  Patient discussed with Dr. Tanna Furry, MD   Dahlia Bailiff, PA-C 03/17/15 3536  Tanna Furry, MD 03/25/15 1501

## 2015-03-16 NOTE — ED Notes (Signed)
Per EMS- c/o headache starting 2 years ago. Unable to acquire specific account of headache details. Patient is not taking any other medications besides Naproxen. Does not appear to have light sensitivity (stared into the light for EMS). No N/V. CBG 87. BP 162/100 HR 90 RR 18. On arrival BP 142 palp.

## 2015-03-16 NOTE — ED Notes (Signed)
Questions denied r/t dc. Daughter at bedside. Pt ambulatory and a&ox4

## 2015-03-16 NOTE — ED Notes (Signed)
Pt complains of headache, dizziness and nausea since this morning at 10AM. Pt states the pain radiates to her back. Denies blurred vision. Pt states she has history of headaches. Pt speaks [place].

## 2015-03-16 NOTE — Discharge Instructions (Signed)
General Headache Without Cause A headache is pain or discomfort felt around the head or neck area. The specific cause of a headache may not be found. There are many causes and types of headaches. A few common ones are:  Tension headaches.  Migraine headaches.  Cluster headaches.  Chronic daily headaches. HOME CARE INSTRUCTIONS   Keep all follow-up appointments with your caregiver or any specialist referral.  Only take over-the-counter or prescription medicines for pain or discomfort as directed by your caregiver.  Lie down in a dark, quiet room when you have a headache.  Keep a headache journal to find out what may trigger your migraine headaches. For example, write down:  What you eat and drink.  How much sleep you get.  Any change to your diet or medicines.  Try massage or other relaxation techniques.  Put ice packs or heat on the head and neck. Use these 3 to 4 times per day for 15 to 20 minutes each time, or as needed.  Limit stress.  Sit up straight, and do not tense your muscles.  Quit smoking if you smoke.  Limit alcohol use.  Decrease the amount of caffeine you drink, or stop drinking caffeine.  Eat and sleep on a regular schedule.  Get 7 to 9 hours of sleep, or as recommended by your caregiver.  Keep lights dim if bright lights bother you and make your headaches worse. SEEK MEDICAL CARE IF:   You have problems with the medicines you were prescribed.  Your medicines are not working.  You have a change from the usual headache.  You have nausea or vomiting. SEEK IMMEDIATE MEDICAL CARE IF:   Your headache becomes severe.  You have a fever.  You have a stiff neck.  You have loss of vision.  You have muscular weakness or loss of muscle control.  You start losing your balance or have trouble walking.  You feel faint or pass out.  You have severe symptoms that are different from your first symptoms. MAKE SURE YOU:   Understand these  instructions.  Will watch your condition.  Will get help right away if you are not doing well or get worse. Document Released: 10/10/2005 Document Revised: 01/02/2012 Document Reviewed: 10/26/2011 [facility] Patient Information 2015 [place], [place]. This information is not intended to replace advice given to you by your health care provider. Make sure you discuss any questions you have with your health care provider.   Emergency Department Resource Guide 1) Find a Doctor and Pay Out of Pocket Although you won't have to find out who is covered by your insurance plan, it is a good idea to ask around and get recommendations. You will then need to call the office and see if the doctor you have chosen will accept you as a new patient and what types of options they offer for patients who are self-pay. Some doctors offer discounts or will set up payment plans for their patients who do not have insurance, but you will need to ask so you aren't surprised when you get to your appointment.  2) Contact Your Local Health Department Not all health departments have doctors that can see patients for sick visits, but many do, so it is worth a call to see if yours does. If you don't know where your local health department is, you can check in your phone book. The CDC also has a tool to help you locate your state's health department, and many state websites also have listings  of all of their local health departments.  3) Find a [facility] If your illness is not likely to be very severe or complicated, you may want to try a walk in clinic. These are popping up all over the country in pharmacies, drugstores, and shopping centers. They're usually staffed by nurse practitioners or physician assistants that have been trained to treat common illnesses and complaints. They're usually fairly quick and inexpensive. However, if you have serious medical issues or chronic medical problems, these are probably not your best  option.  No Primary Care Doctor: - Call Health Connect at  [phone] - they can help you locate a primary care doctor that  accepts your insurance, provides certain services, etc. - Physician Referral Service- [phone]  Chronic Pain Problems: Organization         Address  Phone   Notes  [facility]  [phone] Patients need to be referred by their primary care doctor.   Medication Assistance: Organization         Address  Phone   Notes  [facility] Medication [facility] [address] [place], [place] [address] [phone] --Must be a resident of [facility] -- Must have NO insurance coverage whatsoever (no Medicaid/ Medicare, etc.) -- The pt. MUST have a primary care doctor that directs their care regularly and follows them in the community   MedAssist  [phone]   [employer]  [phone]    Agencies that provide inexpensive medical care: Organization         Address  Phone   Notes  [place]  [phone]   [facility]    [phone]   [facility] [place], [place] [address] [phone]   [place] [address] [place] [phone]   Planned Parenthood    [phone]   [facility]    [phone]   [place] and [place] [address], [place] Phone:  [phone], Fax:  [phone] Hours of Operation:  9 am - 6 pm, M-F.  Also accepts Medicaid/Medicare and self-pay.  [facility] for [place] [address], [address], [place] Phone: [phone], Fax: [phone]. Hours of Operation:  8:30 am - 5:30 pm, M-F.  Also accepts Medicaid and self-pay.  [facility] [place] [address], [place] Phone: [phone]   [place], [place], [place] [phone], Ext. [phone] Mondays & Thursdays: 7-9 AM.  First 15  patients are seen on a first come, first serve basis.    [place] Providers:  Organization         Address  Phone   Notes  [facility] [address],  [phone] Also accepts self-pay patients.  [facility] [id number] [place], [place]  [phone]   [place], [address], [place] [phone]   [facility] [address] [place] [phone]   Lucianne Lei [address], [place]   [phone] Only accepts [place] Access [place] patients after they have their name applied to their card.   Self-Pay (no insurance) in [facility] [place]:  Patent attorney   Notes  Sickle Cell Patients, [facility] [place] [phone]   [facility] [place] [phone]   [facility] [place]  [place], [address], [place] [phone]   [facility]/Dr. Osei-Bonsu  [address] [place] or [address], [place] [phone] Phone number for both [place] and [place] locations is the same.  [facility] [address], [place] [phone]   [facility] [address], [place] or [address] [phone]   [facility] [address], [place] [phone], phone; [phone], fax Sees patients 1st and 3rd Saturday of every month.  Must not qualify for public or private insurance (i.e. Medicaid, Medicare, [place] Health Choice, Veterans' Benefits)  Household income should be no more than 200% of the poverty level The clinic cannot treat you if you are pregnant or think you are pregnant  Sexually transmitted diseases are not treated at the clinic.    Dental  Care: Organization         Address  Phone  Notes  [facility] Department of [facility] [place] [phone] Accepts children up to age 58 who are enrolled in [place] or [place]; pregnant women with a Medicaid card; and children who have applied for Medicaid or [place] Health Choice, but were declined, whose parents can pay a reduced fee at time of service.  [facility] Department of [facility]  [address], [place] [phone] Accepts children up to age 48 who are enrolled in [place] or [place]; pregnant women with a Medicaid card; and children who have applied for Medicaid or [place] Health Choice, but were declined, whose parents can pay a reduced fee at time of service.  [place] Adult Dental Access PROGRAM  [place] [phone] Patients are seen by appointment only. Walk-ins are not accepted. [place] will see patients 105 years of age and older. Monday - Tuesday (8am-5pm) Most Wednesdays (8:30-5pm) $30 per visit, cash only  [facility] Adult Dental Access PROGRAM  [address], [facility] [phone] Patients are seen by appointment only. Walk-ins are not accepted. [place] will see patients 34 years of age and older. One Wednesday Evening (Monthly: Volunteer Based).  $30 per visit, cash only  [place]  [phone] for adults; Children under age 65, call Graduate Pediatric Dentistry at [phone]. Children aged 59-14, please call [phone] to request a pediatric application.  Dental services are provided in all areas of dental care including fillings, crowns and bridges, complete and partial dentures, implants, gum treatment, root canals, and extractions. Preventive care is also provided. Treatment is provided to both adults and children. Patients are selected via a lottery and there is often a waiting list.   [facility] [address], [place]  [phone] [email or web address]   [facility] [address] [place], [place] [phone], Ext. [phone] Second and Fourth Thursday of each month, opens at 6:30 AM; Clinic ends at 9 AM.  Patients are seen on a first-come first-served basis, and a limited number are seen during each clinic.   [facility]  [address] Hillard Danker [place], [place] [phone]   Eligibility  Requirements You must have lived in [place], [place], or [place] counties for at least the last three months.   You cannot be eligible for state or federal sponsored [employer], including [employer], [place], or [employer].   You generally cannot be eligible for healthcare insurance through your employer.    How to apply: Eligibility screenings are held every Tuesday and Wednesday afternoon from 1:00 pm until 4:00 pm. You do not need an appointment for the interview!  [facility] [address] [place], [place]   [place]  [place] Department  [place]  [phone]    Behavioral Health Resources in the Community: Intensive Outpatient Programs Organization         Address  Phone  Notes  [place]. [address], [place], [place] [phone]   [facility] [address], [place], [place]   ADS: Alcohol & Drug Svcs [address] [place], [place]   [place] [address]  [place], [place] or [phone]   Substance Abuse Resources Organization         Address  Phone  Notes  Alcohol and Drug Services  [phone]   [place]  [phone]   The [place]   Chinita Pester  [phone]   Residential & Outpatient Substance Abuse Program  [phone]    Psychological Services Organization         Address  Phone  Notes  [facility] [place]  [place]  [phone]   [place] [address] [place] or [phone]    Mobile Crisis Teams Organization         Address  Phone  Notes  [facility], Mobile Crisis Care Unit  [phone]   [facility]ive Psychotherapeutic Services  [address] [place], [place]   [place] Levels [address] [place] [phone]    Self-Help/Support Groups Organization         Address  Phone             Notes  [place]. of [place] - variety of support groups  [place] Call for more information  Narcotics Anonymous (NA), [facility] [address], [employer] [place]  2 meetings at this location   Special educational needs teacher         Address  Phone  Notes  [facility]idential Treatment [place],    [place]  [phone]   [facility] [place]  [address] [place] [phone], [place], [place]   [place], [place] [phone] Admissions: 8am-3pm M-F  Incentives Substance [place] [address],    [place], [place] [phone]   [facility] [address] Jadene Pierini [place], [place]   [facility] [address],  [place], [place]   [facility]ight Programs - Intensive Outpatient [address], [place], [place]   [facility] ([place].,  [place], [place] or [phone]   [facility] Services (RTS) [address] [place], [place] Accepts Medicaid  [facility] [place] [address]  [place] [phone] Substance Abuse/Addiction Treatment   [facility] Resources Organization  Address  Phone  Notes  [facility]  [phone]   Domenic Schwab, PhD [address] Arlis Porta [place], [place]   [phone] or [phone]   [place], [place] [phone]   [place] [address], [place], [place] [phone] Insurance/Medicaid/sponsorship through [facility] and Families [address] [place]                                    [place], [place] [phone] [place] [address], [place] [phone]    Dr. Adele Schilder  [phone]   [facility] Dept. 1) [address] [place] 2) [place] 3)  [address], [place] [phone]  [phone]   [place] [phone] or [phone] (After Hours)

## 2015-03-16 NOTE — ED Notes (Addendum)
Pt reported having headache for past 2 yrs and worse today than before. Reported having dizziness, vision disturbances when pain is worse, and n/v. Family at bedside.

## 2017-05-10 ENCOUNTER — Encounter (HOSPITAL_COMMUNITY): Payer: Self-pay | Admitting: Emergency Medicine

## 2017-05-10 DIAGNOSIS — R202 Paresthesia of skin: Secondary | ICD-10-CM | POA: Diagnosis not present

## 2017-05-10 DIAGNOSIS — J45909 Unspecified asthma, uncomplicated: Secondary | ICD-10-CM | POA: Diagnosis not present

## 2017-05-10 DIAGNOSIS — R531 Weakness: Secondary | ICD-10-CM | POA: Diagnosis not present

## 2017-05-10 DIAGNOSIS — G43909 Migraine, unspecified, not intractable, without status migrainosus: Secondary | ICD-10-CM | POA: Insufficient documentation

## 2017-05-10 DIAGNOSIS — R51 Headache: Secondary | ICD-10-CM | POA: Diagnosis not present

## 2017-05-10 LAB — CBC
HCT: 38.1 % (ref 36.0–46.0)
Hemoglobin: 13.1 g/dL (ref 12.0–15.0)
MCH: 27.2 pg (ref 26.0–34.0)
MCHC: 34.4 g/dL (ref 30.0–36.0)
MCV: 79.2 fL (ref 78.0–100.0)
PLATELETS: 260 10*3/uL (ref 150–400)
RBC: 4.81 MIL/uL (ref 3.87–5.11)
RDW: 14.6 % (ref 11.5–15.5)
WBC: 9.5 10*3/uL (ref 4.0–10.5)

## 2017-05-10 LAB — I-STAT CHEM 8, ED
BUN: 6 mg/dL (ref 6–20)
CALCIUM ION: 1.17 mmol/L (ref 1.15–1.40)
Chloride: 103 mmol/L (ref 101–111)
Creatinine, Ser: 0.6 mg/dL (ref 0.44–1.00)
GLUCOSE: 140 mg/dL — AB (ref 65–99)
HCT: 41 % (ref 36.0–46.0)
Hemoglobin: 13.9 g/dL (ref 12.0–15.0)
Potassium: 3.3 mmol/L — ABNORMAL LOW (ref 3.5–5.1)
Sodium: 142 mmol/L (ref 135–145)
TCO2: 26 mmol/L (ref 0–100)

## 2017-05-10 LAB — DIFFERENTIAL
BASOS PCT: 0 %
Basophils Absolute: 0 10*3/uL (ref 0.0–0.1)
EOS ABS: 0.2 10*3/uL (ref 0.0–0.7)
Eosinophils Relative: 2 %
Lymphocytes Relative: 41 %
Lymphs Abs: 4 10*3/uL (ref 0.7–4.0)
Monocytes Absolute: 0.8 10*3/uL (ref 0.1–1.0)
Monocytes Relative: 8 %
Neutro Abs: 4.6 10*3/uL (ref 1.7–7.7)
Neutrophils Relative %: 49 %

## 2017-05-10 LAB — I-STAT TROPONIN, ED: Troponin i, poc: 0 ng/mL (ref 0.00–0.08)

## 2017-05-10 NOTE — ED Triage Notes (Signed)
Pt presents to ED for assessment of left sided numbness with heacache intermittent x 3 years, worst episode starting last night.  Pt denies n/v, denies abdominal or chest pain, denies shortness of breath, denies blurred vision, denies changes in speech.

## 2017-05-11 ENCOUNTER — Emergency Department (HOSPITAL_COMMUNITY): Payer: BLUE CROSS/BLUE SHIELD

## 2017-05-11 ENCOUNTER — Emergency Department (HOSPITAL_COMMUNITY)
Admission: EM | Admit: 2017-05-11 | Discharge: 2017-05-11 | Disposition: A | Discharge status: Home / Self Care | Payer: BLUE CROSS/BLUE SHIELD | Attending: Emergency Medicine | Admitting: Emergency Medicine

## 2017-05-11 DIAGNOSIS — R51 Headache: Secondary | ICD-10-CM | POA: Diagnosis not present

## 2017-05-11 DIAGNOSIS — R531 Weakness: Secondary | ICD-10-CM | POA: Diagnosis not present

## 2017-05-11 DIAGNOSIS — R200 Anesthesia of skin: Secondary | ICD-10-CM

## 2017-05-11 DIAGNOSIS — G43109 Migraine with aura, not intractable, without status migrainosus: Secondary | ICD-10-CM

## 2017-05-11 LAB — COMPREHENSIVE METABOLIC PANEL
ALBUMIN: 3.8 g/dL (ref 3.5–5.0)
ALT: 32 U/L (ref 14–54)
AST: 30 U/L (ref 15–41)
Alkaline Phosphatase: 64 U/L (ref 38–126)
Anion gap: 9 (ref 5–15)
BUN: 6 mg/dL (ref 6–20)
CALCIUM: 9.1 mg/dL (ref 8.9–10.3)
CO2: 23 mmol/L (ref 22–32)
CREATININE: 0.7 mg/dL (ref 0.44–1.00)
Chloride: 105 mmol/L (ref 101–111)
GFR calc Af Amer: >60 mL/min (ref >60)
GFR calc non Af Amer: >60 mL/min (ref >60)
GLUCOSE: 141 mg/dL — AB (ref 65–99)
Potassium: 3.5 mmol/L (ref 3.5–5.1)
SODIUM: 137 mmol/L (ref 135–145)
Total Bilirubin: 0.6 mg/dL (ref 0.3–1.2)
Total Protein: 7.6 g/dL (ref 6.5–8.1)

## 2017-05-11 LAB — PROTIME-INR
INR: 1.08
Prothrombin Time: 14 seconds (ref 11.4–15.2)

## 2017-05-11 LAB — APTT: aPTT: 28 seconds (ref 24–36)

## 2017-05-11 LAB — I-STAT TROPONIN, ED: TROPONIN I, POC: 0 ng/mL (ref 0.00–0.08)

## 2017-05-11 MED ORDER — METOCLOPRAMIDE HCL 5 MG/ML IJ SOLN
10.0000 mg | Freq: Once | INTRAMUSCULAR | Status: AC
  Administered 2017-05-11: 10 mg via INTRAVENOUS
  Filled 2017-05-11: qty 2

## 2017-05-11 MED ORDER — KETOROLAC TROMETHAMINE 30 MG/ML IJ SOLN
30.0000 mg | Freq: Once | INTRAMUSCULAR | Status: AC
  Administered 2017-05-11: 30 mg via INTRAVENOUS
  Filled 2017-05-11: qty 1

## 2017-05-11 MED ORDER — DIPHENHYDRAMINE HCL 50 MG/ML IJ SOLN
25.0000 mg | Freq: Once | INTRAMUSCULAR | Status: AC
  Administered 2017-05-11: 25 mg via INTRAVENOUS
  Filled 2017-05-11: qty 1

## 2017-05-11 NOTE — ED Notes (Signed)
PIV attempt x 2 unsuccessful; IV team consult placed

## 2017-05-11 NOTE — Discharge Instructions (Signed)
There is no evidence of stroke. Follow up with the neurologist. Return to the ED if you develop new or worsening symptoms.

## 2017-05-11 NOTE — ED Provider Notes (Signed)
[facility] DEPT Provider Note   CSN: [id number] Arrival date & time: 05/10/17  2320  By signing my name below, I, Reola Mosher, attest that this documentation has been prepared under the direction and in the presence of Zeppelin Commisso, Annie Main, MD. Electronically Signed: Reola Mosher, ED Scribe. 05/11/17. 1:48 AM.  History   Chief Complaint Chief Complaint  Patient presents with  . Numbness   LEVEL V CAVEAT: HPI and ROS limited due to language barrier   The history is provided by the patient. The history is limited by a language barrier. A language interpreter was used [place]).    HPI Comments: Denise Blackburn is a 56 y.o. female with a h/o asthma ad vertigo, who presents to the Emergency Department complaining of gradual onset, intermittent, recurrent, left-sided temporal headache beginning several years ago, acutely worsening since yesterday. Per daughter, she has associated unilateral left-sided weakness to the left arm and room-spinning dizziness. Daughter also notes that she has been experiencing constant zyphoid pain since yesterday as well. Daughter notes that her headache today is consistent with her usual headaches which she experiences everyday; however, it is worsened in severity from her baseline. She additionally states that the pt has never previously experienced weakness with her prior headaches. No sudden onset, thunderclap quality to her headache. She has been taking OTC medications at home w/o relief of her symptoms. Pt denies aphasia, facial droop, blurry vision, diplopia, gait difficulty, nausea, vomiting, or any other associated symptoms.   Past Medical History:  Diagnosis Date  . Asthma    7 yrs ago.  She does not use any inhalers  . GERD (gastroesophageal reflux disease)    EGD performed 15-17 yrs ago in [place]   Patient Active Problem List   Diagnosis Date Noted  . Vertigo 08/02/2012  . Nonspecific elevation of levels of transaminase or lactic acid  dehydrogenase (LDH) 07/05/2012  . Severe headache 06/21/2012  . Epigastric pain 06/21/2012   History reviewed. No pertinent surgical history.  OB History    No data available     Home Medications    Prior to Admission medications   Medication Sig Start Date End Date Taking? Authorizing Provider  HYDROcodone-acetaminophen (NORCO/VICODIN) 5-325 MG per tablet 1/2 to 1 tablet Patient not taking: Reported on 03/16/2015 04/05/13   Shawna Orleans, Doe-Hyun R, DO  naproxen sodium (ANAPROX) 220 MG tablet Take 220-440 mg by mouth every 12 (twelve) hours as needed (pain).    [provider]   Family History History reviewed. No pertinent family history.  Social History Social History  Substance Use Topics  . Smoking status: Never Smoker  . Smokeless tobacco: Never Used  . Alcohol use No   Allergies   Patient has no known allergies.  Review of Systems Review of Systems  Unable to perform ROS: Other (language barrier)   Physical Exam Updated Vital Signs BP (!) 147/95 (BP Location: Left Arm)   Pulse 85   Temp 98 F (36.7 C) (Oral)   Resp 16   SpO2 100%   Physical Exam  Constitutional: She is oriented to person, place, and time. She appears well-developed and well-nourished. No distress.  HENT:  Head: Normocephalic and atraumatic.  Mouth/Throat: Oropharynx is clear and moist. No oropharyngeal exudate.  Eyes: Pupils are equal, round, and reactive to light. Conjunctivae and EOM are normal.  Neck: Normal range of motion. Neck supple.  No meningismus.  Cardiovascular: Normal rate, regular rhythm, normal heart sounds and intact distal pulses.   No murmur  heard. Pulmonary/Chest: Effort normal and breath sounds normal. No respiratory distress.  Abdominal: Soft. There is no tenderness. There is no rebound and no guarding.  Musculoskeletal: Normal range of motion. She exhibits no edema or tenderness.  Neurological: She is alert and oriented to person, place, and time. No cranial nerve  deficit. She exhibits normal muscle tone. Coordination normal.  CN 2-12 intact. No facial droop. Slightly decreased left grip strength and forearm flexion/extension. Otherwise 5/5 strength throughout. No ataxia w/ finger to nose. No pronator drift.   Skin: Skin is warm.  Psychiatric: She has a normal mood and affect. Her behavior is normal.  Nursing note and vitals reviewed.  ED Treatments / Results  DIAGNOSTIC STUDIES: Oxygen Saturation is 100% on RA, normal by my interpretation.   COORDINATION OF CARE: 1:48 AM-Discussed next steps with pt. Pt verbalized understanding and is agreeable with the plan.   Labs (all labs ordered are listed, but only abnormal results are displayed) Labs Reviewed  COMPREHENSIVE METABOLIC PANEL - Abnormal; Notable for the following:       Result Value   Glucose, Bld 141 (*)    All other components within normal limits  I-STAT CHEM 8, ED - Abnormal; Notable for the following:    Potassium 3.3 (*)    Glucose, Bld 140 (*)    All other components within normal limits  PROTIME-INR  APTT  CBC  DIFFERENTIAL  I-STAT TROPONIN, ED  I-STAT TROPONIN, ED  CBG MONITORING, ED   EKG  EKG Interpretation  Date/Time:  Wednesday May 10 2017 23:25:27 EDT Ventricular Rate:  87 PR Interval:  156 QRS Duration: 78 QT Interval:  372 QTC Calculation: 447 R Axis:   68 Text Interpretation:  Normal sinus rhythm Low voltage QRS Nonspecific T wave abnormality Abnormal ECG No previous ECGs available Confirmed by Ezequiel Essex [phone]) on 05/11/2017 2:15:29 AM Also confirmed by Ezequiel Essex [phone]), editor Oswaldo Milian, Beverly ([id number])  on 05/11/2017 8:01:05 AM      Radiology Ct Head Wo Contrast  Result Date: 05/11/2017 CLINICAL DATA:  Left-sided numbness with weakness and headache. EXAM: CT HEAD WITHOUT CONTRAST TECHNIQUE: Contiguous axial images were obtained from the base of the skull through the vertex without intravenous contrast. COMPARISON:  Head CT 04/15/2013  FINDINGS: Brain: No mass lesion, intraparenchymal hemorrhage or extra-axial collection. No evidence of acute cortical infarct. Brain parenchyma and CSF-containing spaces are normal for age. Vascular: No hyperdense vessel or unexpected calcification. Skull: Normal visualized skull base, calvarium and extracranial soft tissues. Sinuses/Orbits: No sinus fluid levels or advanced mucosal thickening. No mastoid effusion. Normal orbits. IMPRESSION: Normal head CT. Electronically Signed   By: Ulyses Jarred M.D.   On: 05/11/2017 00:42   Procedures Procedures  Medications Ordered in ED Medications - No data to display  Initial Impression / Assessment and Plan / ED Course  I have reviewed the triage vital signs and the nursing notes.  Pertinent labs & imaging results that were available during my care of the patient were reviewed by me and considered in my medical decision making (see chart for details).     L arm weakness and numbness since yesterday. Associated with headache that she gets frequently but doesn't usually have numbness. Code stroke not activated due to delay in presentation..   CT head negative. EKG nsr. Troponin negative.  Migraine cocktail given.  MRI performed to r/o stroke.  Xiphoid pain atypical for ACS and constant since yesterday. Troponin negative x2.  MRI of brain and C spine negative.  Strength improved on recheck. Suspect complicated migraine. No evidence of stroke or significant cervical radiculopathy  Follow up with neurology. Return precaution discussed.  Final Clinical Impressions(s) / ED Diagnoses   Final diagnoses:  Numbness  Complicated migraine   New Prescriptions New Prescriptions   No medications on file   I personally performed the services described in this documentation, which was scribed in my presence. The recorded information has been reviewed and is accurate.    Ezequiel Essex, MD 05/11/17 [phone]

## 2017-08-31 DIAGNOSIS — R740 Nonspecific elevation of levels of transaminase and lactic acid dehydrogenase [LDH]: Secondary | ICD-10-CM | POA: Insufficient documentation

## 2017-08-31 DIAGNOSIS — J45909 Unspecified asthma, uncomplicated: Secondary | ICD-10-CM | POA: Insufficient documentation

## 2017-08-31 DIAGNOSIS — R109 Unspecified abdominal pain: Secondary | ICD-10-CM | POA: Diagnosis not present

## 2017-08-31 DIAGNOSIS — K8020 Calculus of gallbladder without cholecystitis without obstruction: Secondary | ICD-10-CM | POA: Insufficient documentation

## 2017-08-31 DIAGNOSIS — R1011 Right upper quadrant pain: Secondary | ICD-10-CM | POA: Diagnosis not present

## 2017-09-01 ENCOUNTER — Emergency Department (HOSPITAL_COMMUNITY): Payer: BLUE CROSS/BLUE SHIELD

## 2017-09-01 ENCOUNTER — Encounter (HOSPITAL_COMMUNITY): Payer: Self-pay | Admitting: Emergency Medicine

## 2017-09-01 ENCOUNTER — Other Ambulatory Visit: Payer: Self-pay

## 2017-09-01 ENCOUNTER — Emergency Department (HOSPITAL_COMMUNITY)
Admission: EM | Admit: 2017-09-01 | Discharge: 2017-09-01 | Disposition: A | Discharge status: Home / Self Care | Payer: BLUE CROSS/BLUE SHIELD | Attending: Emergency Medicine | Admitting: Emergency Medicine

## 2017-09-01 DIAGNOSIS — R1011 Right upper quadrant pain: Secondary | ICD-10-CM | POA: Diagnosis not present

## 2017-09-01 DIAGNOSIS — K8020 Calculus of gallbladder without cholecystitis without obstruction: Secondary | ICD-10-CM

## 2017-09-01 DIAGNOSIS — R740 Nonspecific elevation of levels of transaminase and lactic acid dehydrogenase [LDH]: Secondary | ICD-10-CM

## 2017-09-01 DIAGNOSIS — R7401 Elevation of levels of liver transaminase levels: Secondary | ICD-10-CM

## 2017-09-01 LAB — LIPASE, BLOOD: Lipase: 26 U/L (ref 11–51)

## 2017-09-01 LAB — CBC
HCT: 37.2 % (ref 36.0–46.0)
Hemoglobin: 12.8 g/dL (ref 12.0–15.0)
MCH: 27 pg (ref 26.0–34.0)
MCHC: 34.4 g/dL (ref 30.0–36.0)
MCV: 78.5 fL (ref 78.0–100.0)
PLATELETS: 295 10*3/uL (ref 150–400)
RBC: 4.74 MIL/uL (ref 3.87–5.11)
RDW: 14.5 % (ref 11.5–15.5)
WBC: 10.7 10*3/uL — AB (ref 4.0–10.5)

## 2017-09-01 LAB — URINALYSIS, ROUTINE W REFLEX MICROSCOPIC
BILIRUBIN URINE: NEGATIVE
Glucose, UA: 50 mg/dL — AB
Ketones, ur: NEGATIVE mg/dL
LEUKOCYTES UA: NEGATIVE
Nitrite: NEGATIVE
PH: 8 (ref 5.0–8.0)
Protein, ur: 30 mg/dL — AB
SPECIFIC GRAVITY, URINE: 1.016 (ref 1.005–1.030)

## 2017-09-01 LAB — COMPREHENSIVE METABOLIC PANEL
ALT: 40 U/L (ref 14–54)
AST: 93 U/L — AB (ref 15–41)
Albumin: 4 g/dL (ref 3.5–5.0)
Alkaline Phosphatase: 100 U/L (ref 38–126)
Anion gap: 10 (ref 5–15)
BILIRUBIN TOTAL: 0.7 mg/dL (ref 0.3–1.2)
BUN: 9 mg/dL (ref 6–20)
CALCIUM: 9.1 mg/dL (ref 8.9–10.3)
CO2: 25 mmol/L (ref 22–32)
CREATININE: 0.64 mg/dL (ref 0.44–1.00)
Chloride: 105 mmol/L (ref 101–111)
Glucose, Bld: 184 mg/dL — ABNORMAL HIGH (ref 65–99)
Potassium: 3 mmol/L — ABNORMAL LOW (ref 3.5–5.1)
Sodium: 140 mmol/L (ref 135–145)
Total Protein: 7.6 g/dL (ref 6.5–8.1)

## 2017-09-01 MED ORDER — OXYCODONE HCL 5 MG PO TABS: 5.0000 mg | ORAL_TABLET | ORAL | 0 refills | Status: DC | PRN

## 2017-09-01 MED ORDER — POTASSIUM CHLORIDE CRYS ER 20 MEQ PO TBCR
40.0000 meq | EXTENDED_RELEASE_TABLET | Freq: Once | ORAL | Status: AC
  Administered 2017-09-01: 40 meq via ORAL
  Filled 2017-09-01: qty 2

## 2017-09-01 MED ORDER — MORPHINE SULFATE (PF) 4 MG/ML IV SOLN
4.0000 mg | Freq: Once | INTRAVENOUS | Status: AC
  Administered 2017-09-01: 4 mg via INTRAVENOUS
  Filled 2017-09-01: qty 1

## 2017-09-01 NOTE — Discharge Instructions (Signed)
Take the prescribed medication as directed. See attached paperwork for information about gallstones and low fat diet like we discussed. Follow-up with the general surgery clinic. Return to the ED for new or worsening symptoms.

## 2017-09-01 NOTE — ED Triage Notes (Signed)
Patient complaining of right upper abdominal pain. Patient states the pain started around 8 pm after she ate dinner. Patient is not experiencing any other symptoms.

## 2017-09-01 NOTE — ED Provider Notes (Signed)
[facility] DEPT Provider Note   CSN: [id number] Arrival date & time: 08/31/17  2347     History   Chief Complaint Chief Complaint  Patient presents with  . Abdominal Pain    HPI Denise Blackburn is a 56 y.o. female.  The history is provided by the patient and medical records.  Abdominal Pain      55 y.o. F with hx of asthma, GERD, presenting to the ED for abdominal pain.  This began around 8pm after she ate dinner.  She had chicken and mushrooms.  States it did taste bitter when she ate it.  No nausea, vomiting, or diarrhea associated.  Only pain.  No fever, chills.  Family reports she has had elevated LFT's in the past of unknown etiology.  She has had prior tubal ligation many years ago in [place].  She did try taking some "heartburn medicine" at home but no relief.  Past Medical History:  Diagnosis Date  . Asthma    7 yrs ago.  She does not use any inhalers  . GERD (gastroesophageal reflux disease)    EGD performed 15-17 yrs ago in [place]    Patient Active Problem List   Diagnosis Date Noted  . Vertigo 08/02/2012  . Nonspecific elevation of levels of transaminase or lactic acid dehydrogenase (LDH) 07/05/2012  . Severe headache 06/21/2012  . Epigastric pain 06/21/2012    History reviewed. No pertinent surgical history.  OB History    No data available       Home Medications    Prior to Admission medications   Medication Sig Start Date End Date Taking? Authorizing Provider  HYDROcodone-acetaminophen (NORCO/VICODIN) 5-325 MG per tablet 1/2 to 1 tablet Patient not taking: Reported on 03/16/2015 04/05/13   Shawna Orleans, Doe-Hyun R, DO  naproxen sodium (ANAPROX) 220 MG tablet Take 220-440 mg by mouth every 12 (twelve) hours as needed (pain).    [provider]    Family History History reviewed. No pertinent family history.  Social History Social History   Tobacco Use  . Smoking status: Never Smoker  . Smokeless tobacco: Never  Used  Substance Use Topics  . Alcohol use: No  . Drug use: No     Allergies   Patient has no known allergies.   Review of Systems Review of Systems  Gastrointestinal: Positive for abdominal pain.  All other systems reviewed and are negative.    Physical Exam Updated Vital Signs BP (!) 112/94 (BP Location: Left Arm)   Pulse 94   Temp 97.9 F (36.6 C)   Resp 20   Ht 5' (1.524 m)   Wt 65.8 kg (145 lb)   LMP 08/30/2017   SpO2 97%   BMI 28.32 kg/m   Physical Exam  Constitutional: She is oriented to person, place, and time. She appears well-developed and well-nourished.  HENT:  Head: Normocephalic and atraumatic.  Mouth/Throat: Oropharynx is clear and moist.  Eyes: Conjunctivae and EOM are normal. Pupils are equal, round, and reactive to light.  Neck: Normal range of motion.  Cardiovascular: Normal rate, regular rhythm and normal heart sounds.  Pulmonary/Chest: Effort normal and breath sounds normal.  Abdominal: Soft. Bowel sounds are normal. There is tenderness in the right upper quadrant and epigastric area.  Musculoskeletal: Normal range of motion.  Neurological: She is alert and oriented to person, place, and time.  Skin: Skin is warm and dry.  Psychiatric: She has a normal mood and affect.  Nursing note and vitals reviewed.  ED Treatments / Results  Labs (all labs ordered are listed, but only abnormal results are displayed) Labs Reviewed  COMPREHENSIVE METABOLIC PANEL - Abnormal; Notable for the following components:      Result Value   Potassium 3.0 (*)    Glucose, Bld 184 (*)    AST 93 (*)    All other components within normal limits  CBC - Abnormal; Notable for the following components:   WBC 10.7 (*)    All other components within normal limits  URINALYSIS, ROUTINE W REFLEX MICROSCOPIC - Abnormal; Notable for the following components:   APPearance CLOUDY (*)    Glucose, UA 50 (*)    Hgb urine dipstick LARGE (*)    Protein, ur 30 (*)     Bacteria, UA FEW (*)    Squamous Epithelial / LPF 0-5 (*)    All other components within normal limits  LIPASE, BLOOD    EKG  EKG Interpretation None       Radiology US Abdomen Complete  Result Date: 09/01/2017 CLINICAL DATA:  Right upper quadrant abdominal pain EXAM: ABDOMEN ULTRASOUND COMPLETE COMPARISON:  02/16/2010 FINDINGS: Gallbladder: Multiple stones and sludge, stones measure up to 3 mm. Negative sonographic Murphy. Normal wall thickness. Common bile duct: Diameter: 2.4 mm Liver: Increased echogenicity. No focal hepatic abnormality. Portal vein is patent on color Doppler imaging with normal direction of blood flow towards the liver. IVC: No abnormality visualized. Pancreas: Not well seen due to gas Spleen: Size and appearance within normal limits. Right Kidney: Length: 10.7 cm. Echogenicity within normal limits. No mass or hydronephrosis visualized. Left Kidney: Length: 10.9 cm. Echogenicity within normal limits. No mass or hydronephrosis visualized. Abdominal aorta: No aneurysm visualized. Other findings: None. IMPRESSION: 1. Cholelithiasis/gallbladder sludge without sonographic evidence for acute cholecystitis or biliary dilatation 2. Increased liver echogenicity consistent with fatty change Electronically Signed   By: Donavan Foil M.D.   On: 09/01/2017 02:15    Procedures Procedures (including critical care time)  Medications Ordered in ED Medications - No data to display   Initial Impression / Assessment and Plan / ED Course  I have reviewed the triage vital signs and the nursing notes.  Pertinent labs & imaging results that were available during my care of the patient were reviewed by me and considered in my medical decision making (see chart for details).  56 y.o. F here with epigastric and RUQ pain.  No associated sx.  Onset of pain immediately after eating dinner (chicken and mushrooms).  She is afebrile, non-toxic.  Tenderness in the epigastrium and RUQ on my exam.   No peritoneal signs.  Screening lab work obtained, overall reassuring.  Potassium mildly low at 3.0.  Will replace here.  AST elevated at 93 (family reports hx of transaminitis in the past as well).  Given nature of her symptoms, biliary pathology was suspected and ultrasound was obtained.  Ultrasound does reveal gallstones with sludge but no signs of cholecystitis.  After single dose of morphine, patient feeling much better.  She has been resting comfortably.  Results discussed with patient and family, they acknowledged understanding.  We discussed low fat diet, follow-up with general surgery.  Rx oxycodone for home.  Discussed plan with patient, she acknowledged understanding and agreed with plan of care.  Return precautions given for new or worsening symptoms.  Final Clinical Impressions(s) / ED Diagnoses   Final diagnoses:  Gallstones  Elevated AST (SGOT)    ED Discharge Orders  Ordered    oxyCODONE (OXY IR/ROXICODONE) 5 MG immediate release tablet  Every 4 hours PRN     09/01/17 0331       Larene Pickett, PA-C 20/72/18 [id number]    Delora Fuel, MD 37/44/51 [phone]

## 2017-09-13 ENCOUNTER — Ambulatory Visit: Payer: Self-pay | Admitting: General Surgery

## 2017-09-13 DIAGNOSIS — K8020 Calculus of gallbladder without cholecystitis without obstruction: Secondary | ICD-10-CM | POA: Diagnosis not present

## 2017-10-16 NOTE — Pre-Procedure Instructions (Signed)
[facility] Dupuy  10/16/2017      [facility], [place] - [address] [place] [address] Phone: [phone] Fax: [phone]  [id number] - [place], [place] [place] [address] Phone: [phone] Fax: [phone]    Your procedure is scheduled on Jan 3  Report to [facility] at 530 A.M.  Call this number if you have problems the morning of surgery:  [phone]   Remember:  Do not eat food or drink liquids after midnight.  Take these medicines the morning of surgery with A SIP OF WATER Omeprazole (Prilosec) if needed  Stop taking aspirin, BC's, Goody's, Herbal medications, Fish Oil, Aleve, Ibuprofen, Advil, Motrin, Vitamins   Do not wear jewelry, make-up or nail polish.  Do not wear lotions, powders, or perfumes, or deodorant.  Do not shave 48 hours prior to surgery.  Men may shave face and neck.  Do not bring valuables to the hospital.  [facility] is not responsible for any belongings or valuables.  Contacts, dentures or bridgework may not be worn into surgery.  Leave your suitcase in the car.  After surgery it may be brought to your room.  For patients admitted to the hospital, discharge time will be determined by your treatment team.  Patients discharged the day of surgery will not be allowed to drive home.    Special instructions:  [facility] - Preparing for Surgery  Before surgery, you can play an important role.  Because skin is not sterile, your skin needs to be as free of germs as possible.  You can reduce the number of germs on you skin by washing with CHG (chlorahexidine gluconate) soap before surgery.  CHG is an antiseptic cleaner which kills germs and bonds with the skin to continue killing germs even after washing.  Please DO NOT use if you have an allergy to CHG or antibacterial soaps.  If  your skin becomes reddened/irritated stop using the CHG and inform your nurse when you arrive at Short Stay.  Do not shave (including legs and underarms) for at least 48 hours prior to the first CHG shower.  You may shave your face.  Please follow these instructions carefully:   1.  Shower with CHG Soap the night before surgery and the   morning of Surgery.  2.  If you choose to wash your hair, wash your hair first as usual with your   normal shampoo.  3.  After you shampoo, rinse your hair and body thoroughly to remove the Shampoo.  4.  Use CHG as you would any other liquid soap.  You can apply chg directly   to the skin and wash gently with scrungie or a clean washcloth.  5.  Apply the CHG Soap to your body ONLY FROM THE NECK DOWN.   Do not use on open wounds or open sores.  Avoid contact with your eyes,  ears, mouth and genitals (private parts).  Wash genitals (private parts)  with your normal soap.  6.  Wash thoroughly, paying special attention to the area where your surgery will be performed.  7.  Thoroughly rinse your body with warm water from the neck down.  8.  DO NOT shower/wash with your normal soap after using and rinsing off  the CHG Soap.  9.  Pat yourself dry with a clean towel.  10.  Wear clean pajamas.            11.  Place clean sheets on your bed the night of your first shower and do not sleep with pets.  Day of Surgery  Do not apply any lotions/deoderants the morning of surgery.  Please wear clean clothes to the hospital/surgery center.     Please read over the following fact sheets that you were given. Pain Booklet, Coughing and Deep Breathing and Surgical Site Infection Prevention

## 2017-10-16 NOTE — Progress Notes (Signed)
email sent to interpreting Services and called [phone]  message left for an interpreter on PAT and DOS.

## 2017-10-18 ENCOUNTER — Encounter (HOSPITAL_COMMUNITY)
Admission: RE | Admit: 2017-10-18 | Discharge: 2017-10-18 | Disposition: A | Discharge status: Home / Self Care | Payer: BLUE CROSS/BLUE SHIELD | Source: Ambulatory Visit | Attending: General Surgery | Admitting: General Surgery

## 2017-10-18 ENCOUNTER — Encounter (HOSPITAL_COMMUNITY): Payer: Self-pay

## 2017-10-18 ENCOUNTER — Other Ambulatory Visit: Payer: Self-pay

## 2017-10-18 DIAGNOSIS — Z01812 Encounter for preprocedural laboratory examination: Secondary | ICD-10-CM | POA: Insufficient documentation

## 2017-10-18 DIAGNOSIS — R740 Nonspecific elevation of levels of transaminase and lactic acid dehydrogenase [LDH]: Secondary | ICD-10-CM | POA: Diagnosis not present

## 2017-10-18 DIAGNOSIS — R1013 Epigastric pain: Secondary | ICD-10-CM | POA: Insufficient documentation

## 2017-10-18 DIAGNOSIS — R42 Dizziness and giddiness: Secondary | ICD-10-CM | POA: Diagnosis not present

## 2017-10-18 HISTORY — DX: Headache, unspecified: R51.9

## 2017-10-18 HISTORY — DX: Headache: R51

## 2017-10-18 LAB — CBC
HEMATOCRIT: 39.3 % (ref 36.0–46.0)
Hemoglobin: 13.6 g/dL (ref 12.0–15.0)
MCH: 26.5 pg (ref 26.0–34.0)
MCHC: 34.6 g/dL (ref 30.0–36.0)
MCV: 76.6 fL — AB (ref 78.0–100.0)
PLATELETS: 243 10*3/uL (ref 150–400)
RBC: 5.13 MIL/uL — ABNORMAL HIGH (ref 3.87–5.11)
RDW: 14.3 % (ref 11.5–15.5)
WBC: 6.4 10*3/uL (ref 4.0–10.5)

## 2017-10-18 LAB — HCG, SERUM, QUALITATIVE: Preg, Serum: NEGATIVE

## 2017-10-18 NOTE — Progress Notes (Addendum)
NO PCP.  NO interpreter at pre-admission visit. Daughter Bink Vandervort interpreted and signed the interpreter waiver.  Spoke with [employer] @ [employer], dispensing. Stated an interpreter is set up for day of surgery. Language is "Rade" form of [place].  Junie Panning stated the office was closed Monday and Tuesday, and that is why  We didn't have an interpreter today.

## 2017-10-26 ENCOUNTER — Encounter (HOSPITAL_COMMUNITY)
Admission: RE | Disposition: A | Discharge status: Home / Self Care | Payer: Self-pay | Source: Ambulatory Visit | Attending: General Surgery

## 2017-10-26 ENCOUNTER — Encounter (HOSPITAL_COMMUNITY): Payer: Self-pay | Admitting: *Deleted

## 2017-10-26 ENCOUNTER — Ambulatory Visit (HOSPITAL_COMMUNITY): Payer: BLUE CROSS/BLUE SHIELD | Admitting: Certified Registered"

## 2017-10-26 ENCOUNTER — Other Ambulatory Visit: Payer: Self-pay

## 2017-10-26 ENCOUNTER — Ambulatory Visit (HOSPITAL_COMMUNITY)
Admission: RE | Admit: 2017-10-26 | Discharge: 2017-10-26 | Disposition: A | Discharge status: Home / Self Care | Payer: BLUE CROSS/BLUE SHIELD | Source: Ambulatory Visit | Attending: General Surgery | Admitting: General Surgery

## 2017-10-26 DIAGNOSIS — Z79899 Other long term (current) drug therapy: Secondary | ICD-10-CM | POA: Insufficient documentation

## 2017-10-26 DIAGNOSIS — R1013 Epigastric pain: Secondary | ICD-10-CM | POA: Diagnosis not present

## 2017-10-26 DIAGNOSIS — K219 Gastro-esophageal reflux disease without esophagitis: Secondary | ICD-10-CM | POA: Insufficient documentation

## 2017-10-26 DIAGNOSIS — R740 Nonspecific elevation of levels of transaminase and lactic acid dehydrogenase [LDH]: Secondary | ICD-10-CM | POA: Diagnosis not present

## 2017-10-26 DIAGNOSIS — K8020 Calculus of gallbladder without cholecystitis without obstruction: Secondary | ICD-10-CM | POA: Diagnosis not present

## 2017-10-26 DIAGNOSIS — K8010 Calculus of gallbladder with chronic cholecystitis without obstruction: Secondary | ICD-10-CM | POA: Insufficient documentation

## 2017-10-26 DIAGNOSIS — J45909 Unspecified asthma, uncomplicated: Secondary | ICD-10-CM | POA: Diagnosis not present

## 2017-10-26 HISTORY — PX: CHOLECYSTECTOMY: SHX55

## 2017-10-26 SURGERY — LAPAROSCOPIC CHOLECYSTECTOMY
Anesthesia: General | Site: Abdomen

## 2017-10-26 MED ORDER — SODIUM CHLORIDE 0.9 % IR SOLN
Status: DC | PRN
  Administered 2017-10-26: 1000 mL

## 2017-10-26 MED ORDER — ROCURONIUM BROMIDE 10 MG/ML (PF) SYRINGE
PREFILLED_SYRINGE | INTRAVENOUS | Status: AC
  Filled 2017-10-26: qty 5

## 2017-10-26 MED ORDER — ONDANSETRON HCL 4 MG/2ML IJ SOLN
INTRAMUSCULAR | Status: DC | PRN
  Administered 2017-10-26: 4 mg via INTRAVENOUS

## 2017-10-26 MED ORDER — BUPIVACAINE-EPINEPHRINE 0.25% -1:200000 IJ SOLN
INTRAMUSCULAR | Status: AC
  Filled 2017-10-26: qty 1

## 2017-10-26 MED ORDER — ONDANSETRON HCL 4 MG/2ML IJ SOLN: 4.0000 mg | Freq: Once | INTRAMUSCULAR | Status: DC | PRN

## 2017-10-26 MED ORDER — LACTATED RINGERS IV SOLN
INTRAVENOUS | Status: DC | PRN
  Administered 2017-10-26: 07:00:00 via INTRAVENOUS

## 2017-10-26 MED ORDER — CHLORHEXIDINE GLUCONATE CLOTH 2 % EX PADS: 6.0000 | MEDICATED_PAD | Freq: Once | CUTANEOUS | Status: DC

## 2017-10-26 MED ORDER — MEPERIDINE HCL 25 MG/ML IJ SOLN: 6.2500 mg | INTRAMUSCULAR | Status: DC | PRN

## 2017-10-26 MED ORDER — 0.9 % SODIUM CHLORIDE (POUR BTL) OPTIME
TOPICAL | Status: DC | PRN
  Administered 2017-10-26: 1000 mL

## 2017-10-26 MED ORDER — DEXAMETHASONE SODIUM PHOSPHATE 10 MG/ML IJ SOLN
INTRAMUSCULAR | Status: DC | PRN
  Administered 2017-10-26: 5 mg via INTRAVENOUS

## 2017-10-26 MED ORDER — HYDROMORPHONE HCL 1 MG/ML IJ SOLN
INTRAMUSCULAR | Status: AC
  Administered 2017-10-26: 0.5 mg via INTRAVENOUS
  Filled 2017-10-26: qty 1

## 2017-10-26 MED ORDER — FENTANYL CITRATE (PF) 250 MCG/5ML IJ SOLN
INTRAMUSCULAR | Status: AC
  Filled 2017-10-26: qty 5

## 2017-10-26 MED ORDER — IOPAMIDOL (ISOVUE-300) INJECTION 61%
INTRAVENOUS | Status: AC
  Filled 2017-10-26: qty 50

## 2017-10-26 MED ORDER — DEXAMETHASONE SODIUM PHOSPHATE 10 MG/ML IJ SOLN
INTRAMUSCULAR | Status: AC
  Filled 2017-10-26: qty 1

## 2017-10-26 MED ORDER — ETOMIDATE 2 MG/ML IV SOLN
INTRAVENOUS | Status: AC
  Filled 2017-10-26: qty 10

## 2017-10-26 MED ORDER — ARTIFICIAL TEARS OPHTHALMIC OINT
TOPICAL_OINTMENT | OPHTHALMIC | Status: AC
  Filled 2017-10-26: qty 7

## 2017-10-26 MED ORDER — BUPIVACAINE-EPINEPHRINE 0.25% -1:200000 IJ SOLN
INTRAMUSCULAR | Status: DC | PRN
  Administered 2017-10-26: 22 mL

## 2017-10-26 MED ORDER — LIDOCAINE 2% (20 MG/ML) 5 ML SYRINGE
INTRAMUSCULAR | Status: DC | PRN
  Administered 2017-10-26: 100 mg via INTRAVENOUS

## 2017-10-26 MED ORDER — FENTANYL CITRATE (PF) 100 MCG/2ML IJ SOLN
INTRAMUSCULAR | Status: DC | PRN
  Administered 2017-10-26: 100 ug via INTRAVENOUS

## 2017-10-26 MED ORDER — ROCURONIUM BROMIDE 10 MG/ML (PF) SYRINGE
PREFILLED_SYRINGE | INTRAVENOUS | Status: DC | PRN
  Administered 2017-10-26: 50 mg via INTRAVENOUS

## 2017-10-26 MED ORDER — HYDROMORPHONE HCL 1 MG/ML IJ SOLN
0.2500 mg | INTRAMUSCULAR | Status: DC | PRN
  Administered 2017-10-26 (×2): 0.5 mg via INTRAVENOUS

## 2017-10-26 MED ORDER — SUGAMMADEX SODIUM 500 MG/5ML IV SOLN
INTRAVENOUS | Status: DC | PRN
  Administered 2017-10-26: 250 mg via INTRAVENOUS

## 2017-10-26 MED ORDER — PROPOFOL 10 MG/ML IV BOLUS
INTRAVENOUS | Status: AC
  Filled 2017-10-26: qty 20

## 2017-10-26 MED ORDER — PROPOFOL 1000 MG/100ML IV EMUL
INTRAVENOUS | Status: AC
  Filled 2017-10-26: qty 100

## 2017-10-26 MED ORDER — SUGAMMADEX SODIUM 500 MG/5ML IV SOLN
INTRAVENOUS | Status: AC
  Filled 2017-10-26: qty 5

## 2017-10-26 MED ORDER — OXYCODONE HCL 5 MG PO TABS: 5.0000 mg | ORAL_TABLET | Freq: Four times a day (QID) | ORAL | 0 refills | Status: DC | PRN

## 2017-10-26 MED ORDER — MIDAZOLAM HCL 5 MG/5ML IJ SOLN
INTRAMUSCULAR | Status: DC | PRN
  Administered 2017-10-26 (×2): 1 mg via INTRAVENOUS

## 2017-10-26 MED ORDER — MIDAZOLAM HCL 2 MG/2ML IJ SOLN
INTRAMUSCULAR | Status: AC
Start: 2017-10-26 — End: ?
  Filled 2017-10-26: qty 2

## 2017-10-26 MED ORDER — CEFAZOLIN SODIUM-DEXTROSE 2-4 GM/100ML-% IV SOLN
2.0000 g | INTRAVENOUS | Status: AC
  Administered 2017-10-26: 2 g via INTRAVENOUS
  Filled 2017-10-26: qty 100

## 2017-10-26 MED ORDER — LIDOCAINE 2% (20 MG/ML) 5 ML SYRINGE
INTRAMUSCULAR | Status: AC
  Filled 2017-10-26: qty 5

## 2017-10-26 MED ORDER — PROPOFOL 10 MG/ML IV BOLUS
INTRAVENOUS | Status: DC | PRN
  Administered 2017-10-26: 120 mg via INTRAVENOUS

## 2017-10-26 MED ORDER — ONDANSETRON HCL 4 MG/2ML IJ SOLN
INTRAMUSCULAR | Status: AC
  Filled 2017-10-26: qty 2

## 2017-10-26 SURGICAL SUPPLY — 44 items
APPLIER CLIP 5 13 M/L LIGAMAX5 (MISCELLANEOUS) ×3
CANISTER SUCT 3000ML PPV (MISCELLANEOUS) ×3 IMPLANT
CHLORAPREP W/TINT 26ML (MISCELLANEOUS) ×3 IMPLANT
CLIP APPLIE 5 13 M/L LIGAMAX5 (MISCELLANEOUS) ×2 IMPLANT
COVER MAYO STAND STRL (DRAPES) IMPLANT
COVER SURGICAL LIGHT HANDLE (MISCELLANEOUS) ×3 IMPLANT
DERMABOND ADVANCED (GAUZE/BANDAGES/DRESSINGS) ×1
DERMABOND ADVANCED .7 DNX12 (GAUZE/BANDAGES/DRESSINGS) ×2 IMPLANT
DRAPE C-ARM 42X72 X-RAY (DRAPES) IMPLANT
ELECT REM PT RETURN 9FT ADLT (ELECTROSURGICAL) ×3
ELECTRODE REM PT RTRN 9FT ADLT (ELECTROSURGICAL) ×2 IMPLANT
FILTER SMOKE EVAC LAPAROSHD (FILTER) ×3 IMPLANT
GLOVE BIO SURGEON STRL SZ8 (GLOVE) ×3 IMPLANT
GLOVE BIOGEL PI IND STRL 6.5 (GLOVE) ×6 IMPLANT
GLOVE BIOGEL PI IND STRL 8 (GLOVE) ×2 IMPLANT
GLOVE BIOGEL PI INDICATOR 6.5 (GLOVE) ×3
GLOVE BIOGEL PI INDICATOR 8 (GLOVE) ×1
GLOVE SURG SS PI 6.5 STRL IVOR (GLOVE) ×6 IMPLANT
GOWN STRL REUS W/ TWL LRG LVL3 (GOWN DISPOSABLE) ×4 IMPLANT
GOWN STRL REUS W/ TWL XL LVL3 (GOWN DISPOSABLE) ×2 IMPLANT
GOWN STRL REUS W/TWL LRG LVL3 (GOWN DISPOSABLE) ×2
GOWN STRL REUS W/TWL XL LVL3 (GOWN DISPOSABLE) ×1
KIT BASIN OR (CUSTOM PROCEDURE TRAY) ×3 IMPLANT
KIT ROOM TURNOVER OR (KITS) ×3 IMPLANT
L-HOOK LAP DISP 36CM (ELECTROSURGICAL) ×3
LHOOK LAP DISP 36CM (ELECTROSURGICAL) ×2 IMPLANT
NEEDLE 22X1 1/2 (OR ONLY) (NEEDLE) ×3 IMPLANT
NS IRRIG 1000ML POUR BTL (IV SOLUTION) ×3 IMPLANT
PAD ARMBOARD 7.5X6 YLW CONV (MISCELLANEOUS) ×3 IMPLANT
PENCIL BUTTON HOLSTER BLD 10FT (ELECTRODE) ×3 IMPLANT
POUCH RETRIEVAL ECOSAC 10 (ENDOMECHANICALS) ×2 IMPLANT
POUCH RETRIEVAL ECOSAC 10MM (ENDOMECHANICALS) ×1
SCISSORS LAP 5X35 DISP (ENDOMECHANICALS) ×3 IMPLANT
SET CHOLANGIOGRAPH 5 50 .035 (SET/KITS/TRAYS/PACK) IMPLANT
SET IRRIG TUBING LAPAROSCOPIC (IRRIGATION / IRRIGATOR) ×3 IMPLANT
SLEEVE ENDOPATH XCEL 5M (ENDOMECHANICALS) ×6 IMPLANT
SPECIMEN JAR SMALL (MISCELLANEOUS) ×3 IMPLANT
SUT VIC AB 4-0 PS2 27 (SUTURE) ×3 IMPLANT
TOWEL OR 17X24 6PK STRL BLUE (TOWEL DISPOSABLE) ×3 IMPLANT
TOWEL OR 17X26 10 PK STRL BLUE (TOWEL DISPOSABLE) ×3 IMPLANT
TRAY LAPAROSCOPIC MC (CUSTOM PROCEDURE TRAY) ×3 IMPLANT
TROCAR XCEL BLUNT TIP 100MML (ENDOMECHANICALS) ×3 IMPLANT
TROCAR XCEL NON-BLD 5MMX100MML (ENDOMECHANICALS) ×3 IMPLANT
TUBING INSUFFLATION (TUBING) ×3 IMPLANT

## 2017-10-26 NOTE — H&P (Signed)
Denise Blackburn is an 57 y.o. female.   Chief Complaint: RUQ pain HPI: Symptomatic cholelithiasis - presents for laparoscopic cholecystectomy.  Past Medical History:  Diagnosis Date  . Asthma    7 yrs ago.  She does not use any inhalers  . GERD (gastroesophageal reflux disease)    EGD performed 15-17 yrs ago in [place]  . Headache    migraines    History reviewed. No pertinent surgical history.  History reviewed. No pertinent family history. Social History:  reports that  has never smoked. she has never used smokeless tobacco. She reports that she does not drink alcohol or use drugs.  Allergies: No Known Allergies  Medications Prior to Admission  Medication Sig Dispense Refill  . acetaminophen (TYLENOL) 500 MG tablet Take 1,000 mg by mouth every 6 (six) hours as needed for mild pain.    Marland Kitchen HYDROcodone-acetaminophen (NORCO/VICODIN) 5-325 MG per tablet 1/2 to 1 tablet (Patient not taking: Reported on 03/16/2015) 30 tablet 0  . omeprazole (PRILOSEC) 20 MG capsule Take 20 mg daily as needed by mouth (Heart burn).    Marland Kitchen oxyCODONE (OXY IR/ROXICODONE) 5 MG immediate release tablet Take 1 tablet (5 mg total) every 4 (four) hours as needed by mouth for severe pain. (Patient not taking: Reported on 10/13/2017) 15 tablet 0    No results found for this or any previous visit (from the past 48 hour(s)). No results found.  ROS  Blood pressure 139/87, pulse 74, temperature 97.9 F (36.6 C), temperature source Oral, resp. rate 18, weight 61.2 kg (135 lb), SpO2 100 %. Physical Exam  Constitutional: She is oriented to person, place, and time. She appears well-developed and well-nourished.  HENT:  Head: Normocephalic.  Eyes: EOM are normal. Pupils are equal, round, and reactive to light.  Neck: Neck supple.  Cardiovascular: Normal rate and regular rhythm.  Respiratory: Breath sounds normal. No respiratory distress.  GI: Soft. She exhibits no distension. There is no tenderness. There is no rebound.   Musculoskeletal: Normal range of motion.  Neurological: She is alert and oriented to person, place, and time.  Psychiatric: She has a normal mood and affect.     Assessment/Plan Symptomatic cholelithiasis - for laparoscopic cholecystectomy, possible IOC. Procedure, risks, and benefits discussed using interpreter. She agrees.  Zenovia Jarred, MD 10/26/2017, 6:48 AM

## 2017-10-26 NOTE — Anesthesia Preprocedure Evaluation (Signed)
Anesthesia Evaluation  Patient identified by MRN, date of birth, ID band Patient awake    Reviewed: Allergy & Precautions, NPO status , Patient's Chart, lab work & pertinent test results  Airway Mallampati: I  TM Distance: >3 FB Neck ROM: Full    Dental   Pulmonary    Pulmonary exam normal        Cardiovascular Normal cardiovascular exam     Neuro/Psych    GI/Hepatic GERD  Medicated and Controlled,  Endo/Other    Renal/GU      Musculoskeletal   Abdominal   Peds  Hematology   Anesthesia Other Findings   Reproductive/Obstetrics                             Anesthesia Physical Anesthesia Plan  ASA: II  Anesthesia Plan: General   Post-op Pain Management:    Induction: Intravenous  PONV Risk Score and Plan: 3 and Dexamethasone, Ondansetron and Midazolam  Airway Management Planned: Oral ETT  Additional Equipment:   Intra-op Plan:   Post-operative Plan: Extubation in OR  Informed Consent: I have reviewed the patients History and Physical, chart, labs and discussed the procedure including the risks, benefits and alternatives for the proposed anesthesia with the patient or authorized representative who has indicated his/her understanding and acceptance.     Plan Discussed with: CRNA and Surgeon  Anesthesia Plan Comments:         Anesthesia Quick Evaluation

## 2017-10-26 NOTE — Anesthesia Procedure Notes (Signed)
Procedure Name: Intubation Date/Time: 10/26/2017 7:37 AM Performed by: Moshe Salisbury, CRNA Pre-anesthesia Checklist: Patient identified, Emergency Drugs available, Suction available and Patient being monitored Patient Re-evaluated:Patient Re-evaluated prior to induction Oxygen Delivery Method: Circle System Utilized Preoxygenation: Pre-oxygenation with 100% oxygen Induction Type: IV induction Ventilation: Mask ventilation without difficulty Laryngoscope Size: Mac and 3 Grade View: Grade I Tube type: Oral Tube size: 7.0 mm Number of attempts: 1 Airway Equipment and Method: Stylet Placement Confirmation: ETT inserted through vocal cords under direct vision,  positive ETCO2 and breath sounds checked- equal and bilateral Secured at: 19 cm Tube secured with: Tape Dental Injury: Teeth and Oropharynx as per pre-operative assessment

## 2017-10-26 NOTE — Op Note (Signed)
10/26/2017  8:14 AM  PATIENT:  Denise Blackburn  57 y.o. female  PRE-OPERATIVE DIAGNOSIS:  symptomatic cholelithiasis  POST-OPERATIVE DIAGNOSIS:  symptomatic cholelithiasis  PROCEDURE:  Procedure(s): LAPAROSCOPIC CHOLECYSTECTOMY  SURGEON:  Surgeon(s): Georganna Skeans, MD  ASSISTANTS: none   ANESTHESIA:   local and general  EBL:  Total I/O In: 700 [I.V.:700] Out: 25 [Blood:25]  BLOOD ADMINISTERED:none  DRAINS: none   SPECIMEN:  Excision  DISPOSITION OF SPECIMEN:  PATHOLOGY  COUNTS:  YES  DICTATION: .Dragon Dictation Procedure in detail: Denise Blackburn presents for laparoscopic cholecystectomy.  She was identified in the preop holding area.  Informed consent was obtained.  She received intravenous antibiotics.  She was brought to the operating room and general endotracheal anesthesia was administered by the anesthesia staff.  Her abdomen was prepped and draped in a sterile fashion.  Timeout procedure was performed.The infraumbilical region was infiltrated with local. Infraumbilical incision was made. Subcutaneous tissues were dissected down revealing the anterior fascia. This was divided sharply along the midline. Peritoneal cavity was entered under direct vision without complication. A 0 Vicryl pursestring was placed around the fascial opening. Hassan trocar was inserted into the abdomen. The abdomen was insufflated with carbon dioxide in standard fashion. Under direct vision a 5 mm epigastric and 2 5 mm right abdominal ports were placed.  Local was used at each port site.  Laparoscopic exploration revealed some omental adhesions up to the liver which were taken down.  The gallbladder was retracted superior medially.  The infundibulum was retracted inferior laterally.  Dissection began laterally and progressed medially easily identifying the cystic duct.  The cystic duct common duct junction was visualized.  Due to good visualization and normal labs, nucleogram was performed.  Dissection  continued until we had a critical view of safety between the cystic duct, the liver, and the gallbladder.  3 clips were placed proximally and the cystic duct and one was placed distally and it was divided.  The cystic artery was then dissected out.  It was clipped twice proximally and once distally.  It was then divided.  The gallbladder was taken off the liver bed using Bovie cautery.  Excellent hemostasis was obtained.  The gallbladder was placed in a bag and removed from the abdomen.  It was sent to pathology.  The liver bed was irrigated and it was dry.  Clips remain in good position.  Irrigation fluid was evacuated.  Ports were removed under direct vision.  Pneumoperitoneum was released.  Infraumbilical fascia was closed by tying the pursestring.  All 4 wounds were irrigated and the skin of each was closed with running 4-0 Vicryl followed by Dermabond.  All counts were correct.  She tolerated the procedure well without apparent complication and was taken to recovery in stable condition. PATIENT DISPOSITION:  PACU - hemodynamically stable.   Delay start of Pharmacological VTE agent (>24hrs) due to surgical blood loss or risk of bleeding:  no  Georganna Skeans, MD, MPH, FACS Pager: [phone]  1/3/20198:14 AM

## 2017-10-26 NOTE — Anesthesia Postprocedure Evaluation (Signed)
Anesthesia Post Note  Patient: Denise Blackburn  Procedure(s) Performed: LAPAROSCOPIC CHOLECYSTECTOMY (N/A Abdomen)     Patient location during evaluation: PACU Anesthesia Type: General Level of consciousness: awake and alert Pain management: pain level controlled Vital Signs Assessment: post-procedure vital signs reviewed and stable Respiratory status: spontaneous breathing, nonlabored ventilation, respiratory function stable and patient connected to nasal cannula oxygen Cardiovascular status: blood pressure returned to baseline and stable Postop Assessment: no apparent nausea or vomiting Anesthetic complications: no    Last Vitals:  Vitals:   10/26/17 0900 10/26/17 0906  BP:  119/79  Pulse: 65 66  Resp: 14 14  Temp:    SpO2: 100% 100%    Last Pain:  Vitals:   10/26/17 0819  TempSrc:   PainSc: 0-No pain                 Hektor Huston DAVID

## 2017-10-26 NOTE — Interval H&P Note (Signed)
History and Physical Interval Note:  10/26/2017 6:50 AM  [name]  has presented today for surgery, with the diagnosis of symptomatic cholelithiasis  The various methods of treatment have been discussed with the patient and family. After consideration of risks, benefits and other options for treatment, the patient has consented to  Procedure(s): LAPAROSCOPIC CHOLECYSTECTOMY WITH POSSIBLE  INTRAOPERATIVE CHOLANGIOGRAM (N/A) as a surgical intervention .  The patient's history has been reviewed, patient examined, no change in status, stable for surgery.  I have reviewed the patient's chart and labs.  Questions were answered to the patient's satisfaction.     Denise Blackburn

## 2017-10-26 NOTE — Transfer of Care (Signed)
Immediate Anesthesia Transfer of Care Note  Patient: Denise Blackburn  Procedure(s) Performed: LAPAROSCOPIC CHOLECYSTECTOMY (N/A Abdomen)  Patient Location: PACU  Anesthesia Type:General  Level of Consciousness: drowsy and patient cooperative  Airway & Oxygen Therapy: Patient Spontanous Breathing and Patient connected to nasal cannula oxygen  Post-op Assessment: Report given to RN, Post -op Vital signs reviewed and stable and Patient moving all extremities  Post vital signs: Reviewed and stable  Last Vitals:  Vitals:   10/26/17 0558 10/26/17 0819  BP: 139/87 (P) 117/74  Pulse: 74 (P) 77  Resp: 18 (P) 12  Temp: 36.6 C (!) (P) 36.4 C  SpO2: 100% (P) 100%    Last Pain:  Vitals:   10/26/17 0630  TempSrc:   PainSc: 4          Complications: No apparent anesthesia complications

## 2017-10-27 ENCOUNTER — Encounter (HOSPITAL_COMMUNITY): Payer: Self-pay | Admitting: General Surgery

## 2018-04-18 ENCOUNTER — Ambulatory Visit: Payer: Self-pay | Admitting: Family Medicine

## 2018-04-18 NOTE — Telephone Encounter (Signed)
Dr. Sarajane Jews - FYI. Pt is scheduled to Establish Care with Dr. Martinique on 7/2. Thanks!

## 2018-04-18 NOTE — Telephone Encounter (Signed)
In going through assessment questions, caller was not with pt. Family member will call back when he is with pt. May need to go through questions again.  Answer Assessment - Initial Assessment Questions 1. LOCATION: "Where does it hurt?"      Whole head 2. ONSET: "When did the headache start?" (Minutes, hours or days)      Unsure when it happended 3. PATTERN: "Does the pain come and go, or has it been constant since it started?"     Comes and goes 4. SEVERITY: "How bad is the pain?" and "What does it keep you from doing?"  (e.g., Scale 1-10; mild, moderate, or severe)   - MILD (1-3): doesn't interfere with normal activities    - MODERATE (4-7): interferes with normal activities or awakens from sleep    - SEVERE (8-10): excruciating pain, unable to do any normal activities        10 5. RECURRENT SYMPTOM: "Have you ever had headaches before?" If so, ask: "When was the last time?" and "What happened that time?"      Yes states she has them all the time 6. CAUSE: "What do you think is causing the headache?"     Pt is unsure 7. MIGRAINE: "Have you been diagnosed with migraine headaches?" If so, ask: "Is this headache similar?"      no 8. HEAD INJURY: "Has there been any recent injury to the head?"      no 9. OTHER SYMPTOMS: "Do you have any other symptoms?" (fever, stiff neck, eye pain, sore throat, cold symptoms)     *No Answer* 10. PREGNANCY: "Is there any chance you are pregnant?" "When was your last menstrual period?"       *No Answer*  Protocols used: HEADACHE-A-AH

## 2018-04-18 NOTE — Telephone Encounter (Signed)
Pt with h/o headache and dizziness. Pt's BP 134/70 HR 93 at time of call. Per her daughter who served as interpreter([employer] unable to find someone who could translate) stated pt c/o pounding headache and eye pressure. Pt c/o pain at the base of her skull. Pt stated it feels like something is on her eyes. Pain is located on the sides, on top and the back of her head. Pt able to touch chin to chest.  She stated that sometimes when she eats she feels dizzy. She stated she feels dizzy when lying down in bed. Pt stated that she has had the dizziness for at least 5 years. Care advice given and acute made for tomorrow with Dr Sarajane Jews. Moved New patient appointment to 04/24/18 with Dr Martinique.

## 2018-04-18 NOTE — Telephone Encounter (Signed)
  Reason for Disposition . [1] MODERATE dizziness (e.g., interferes with normal activities) AND [2] has NOT been evaluated by physician for this  (Exception: dizziness caused by heat exposure, sudden standing, or poor fluid intake)  Answer Assessment - Initial Assessment Questions 1. DESCRIPTION: "Describe your dizziness."     whoosing sound can feel heart beat in her head worse when she is laying down 2. LIGHTHEADED: "Do you feel lightheaded?" (e.g., somewhat faint, woozy, weak upon standing)     yes 3. VERTIGO: "Do you feel like either you or the room is spinning or tilting?" (i.e. vertigo)     no 4. SEVERITY: "How bad is it?"  "Do you feel like you are going to faint?" "Can you stand and walk?"   - MILD - walking normally   - MODERATE - interferes with normal activities (e.g., work, school)    - SEVERE - unable to stand, requires support to walk, feels like passing out now.      Only lays down feels like she is going to pass out 5. ONSET:  "When did the dizziness begin?"     4- 5 years 6. AGGRAVATING FACTORS: "Does anything make it worse?" (e.g., standing, change in head position)     Laying down, when "she is thinking can feel through to her stomach" 7. HEART RATE: "Can you tell me your heart rate?" "How many beats in 15 seconds?"  (Note: not all patients can do this)       93 per minute per BP machine 8. CAUSE: "What do you think is causing the dizziness?"     Pt does not know - sometimes when she eats it makes her feel dizzy 9. RECURRENT SYMPTOM: "Have you had dizziness before?" If so, ask: "When was the last time?" "What happened that time?"     no 10. OTHER SYMPTOMS: "Do you have any other symptoms?" (e.g., fever, chest pain, vomiting, diarrhea, bleeding)       Feels pressure in her head and her eyes feel like there is something in them. States her head feel heavy 11. PREGNANCY: "Is there any chance you are pregnant?" "When was your last menstrual period?"       n/a  Protocols  used: DIZZINESS [facility]

## 2018-04-19 ENCOUNTER — Encounter: Payer: Self-pay | Admitting: Family Medicine

## 2018-04-19 ENCOUNTER — Ambulatory Visit: Payer: BLUE CROSS/BLUE SHIELD | Admitting: Family Medicine

## 2018-04-19 VITALS — BP 120/80 | HR 75 | Temp 98.4°F | Wt 125.6 lb

## 2018-04-19 DIAGNOSIS — I10 Essential (primary) hypertension: Secondary | ICD-10-CM

## 2018-04-19 DIAGNOSIS — R42 Dizziness and giddiness: Secondary | ICD-10-CM

## 2018-04-19 DIAGNOSIS — R51 Headache: Secondary | ICD-10-CM

## 2018-04-19 DIAGNOSIS — R519 Headache, unspecified: Secondary | ICD-10-CM

## 2018-04-19 MED ORDER — AMLODIPINE BESYLATE 5 MG PO TABS: 5.0000 mg | ORAL_TABLET | Freq: Every day | ORAL | 0 refills | Status: DC

## 2018-04-19 MED ORDER — HYDROCHLOROTHIAZIDE 25 MG PO TABS: 25.0000 mg | ORAL_TABLET | Freq: Every day | ORAL | 0 refills | Status: DC

## 2018-04-19 NOTE — Progress Notes (Signed)
   Subjective:    Patient ID: Denise Blackburn, female    DOB: October 18, 1961, 57 y.o.   MRN: [id number]  HPI Here to re-establish after a 5 year absence to discuss episodes of elevated BP which cause dizziness and headaches. She has been to the ER a few times in the past few years, and she had labs in November which showed normal renal function and she had a brain MRI last July which was unremarkable. She speaks [place] and we spoke through an interpreter. She has a hx of HTN and she had been prescribed a BP medication while in [place] about a year ago. She shows me a box of samples and this is Amlodipine 5mg  which she takes daily. She checks her BP at home every day. Lately the systolic readings have jumped up from about 120 to the range of 140-150. When it gest this high she gets global headaches and she feels dizzy. Her description of this dizziness sounds like vertigo. No SOB or chest pain. Today she feels fine. She is scheduled to establish with Dr. Betty Martinique as her new PCP on 04-24-18, but she comes in today because she is worried. No tobacco use.    Review of Systems  Constitutional: Negative.   Respiratory: Negative.   Cardiovascular: Negative.   Neurological: Positive for dizziness and headaches.       Objective:   Physical Exam  Constitutional: She is oriented to person, place, and time. She appears well-developed and well-nourished. No distress.  Eyes: Pupils are equal, round, and reactive to light. Conjunctivae and EOM are normal.  Neck: Neck supple. No thyromegaly present.  Cardiovascular: Normal rate, regular rhythm, normal heart sounds and intact distal pulses.  Pulmonary/Chest: Effort normal and breath sounds normal. No stridor. No respiratory distress. She has no wheezes. She has no rales.  Musculoskeletal: She exhibits no edema.  Lymphadenopathy:    She has no cervical adenopathy.  Neurological: She is alert and oriented to person, place, and time. No cranial nerve deficit. She  exhibits normal muscle tone. Coordination normal.          Assessment & Plan:  She has HTN which has not been well controlled. We will refill the Amlodipine 5 mg daily and we will add HCTZ 25 mg daily. She will follow up as scheduled with Dr. Martinique next week. I am sure she will get labs, etc at that time. We talked about reducing the sodium in her diet as well.  Alysia Penna, MD

## 2018-04-24 ENCOUNTER — Encounter: Payer: Self-pay | Admitting: Family Medicine

## 2018-04-24 ENCOUNTER — Ambulatory Visit: Payer: BLUE CROSS/BLUE SHIELD | Admitting: Family Medicine

## 2018-04-24 VITALS — BP 124/83 | HR 78 | Temp 98.0°F | Resp 12 | Ht 60.0 in | Wt 124.1 lb

## 2018-04-24 DIAGNOSIS — I10 Essential (primary) hypertension: Secondary | ICD-10-CM | POA: Diagnosis not present

## 2018-04-24 DIAGNOSIS — E876 Hypokalemia: Secondary | ICD-10-CM | POA: Diagnosis not present

## 2018-04-24 DIAGNOSIS — G44229 Chronic tension-type headache, not intractable: Secondary | ICD-10-CM | POA: Diagnosis not present

## 2018-04-24 DIAGNOSIS — R739 Hyperglycemia, unspecified: Secondary | ICD-10-CM

## 2018-04-24 LAB — COMPREHENSIVE METABOLIC PANEL
ALBUMIN: 4.4 g/dL (ref 3.5–5.2)
ALK PHOS: 83 U/L (ref 39–117)
ALT: 12 U/L (ref 0–35)
AST: 17 U/L (ref 0–37)
BUN: 15 mg/dL (ref 6–23)
CHLORIDE: 94 meq/L — AB (ref 96–112)
CO2: 34 mEq/L — ABNORMAL HIGH (ref 19–32)
Calcium: 9.5 mg/dL (ref 8.4–10.5)
Creatinine, Ser: 0.53 mg/dL (ref 0.40–1.20)
GFR: 126.05 mL/min (ref >60.00)
Glucose, Bld: 87 mg/dL (ref 70–99)
POTASSIUM: 3 meq/L — AB (ref 3.5–5.1)
Sodium: 137 mEq/L (ref 135–145)
TOTAL PROTEIN: 8 g/dL (ref 6.0–8.3)
Total Bilirubin: 0.7 mg/dL (ref 0.2–1.2)

## 2018-04-24 LAB — POCT GLYCOSYLATED HEMOGLOBIN (HGB A1C): HEMOGLOBIN A1C: 5 % (ref 4.0–5.6)

## 2018-04-24 LAB — CBC WITH DIFFERENTIAL/PLATELET
BASOS PCT: 0.6 % (ref 0.0–3.0)
Basophils Absolute: 0 10*3/uL (ref 0.0–0.1)
EOS PCT: 1.8 % (ref 0.0–5.0)
Eosinophils Absolute: 0.1 10*3/uL (ref 0.0–0.7)
HCT: 42.2 % (ref 36.0–46.0)
HEMOGLOBIN: 14.3 g/dL (ref 12.0–15.0)
LYMPHS ABS: 2.4 10*3/uL (ref 0.7–4.0)
Lymphocytes Relative: 33.9 % (ref 12.0–46.0)
MCHC: 33.8 g/dL (ref 30.0–36.0)
MCV: 78.2 fl (ref 78.0–100.0)
MONO ABS: 0.7 10*3/uL (ref 0.1–1.0)
MONOS PCT: 9.9 % (ref 3.0–12.0)
NEUTROS PCT: 53.8 % (ref 43.0–77.0)
Neutro Abs: 3.7 10*3/uL (ref 1.4–7.7)
Platelets: 269 10*3/uL (ref 150.0–400.0)
RBC: 5.4 Mil/uL — ABNORMAL HIGH (ref 3.87–5.11)
RDW: 14.3 % (ref 11.5–15.5)
WBC: 7 10*3/uL (ref 4.0–10.5)

## 2018-04-24 LAB — TSH: TSH: 0.83 u[IU]/mL (ref 0.35–4.50)

## 2018-04-24 MED ORDER — AMLODIPINE BESYLATE 5 MG PO TABS
5.0000 mg | ORAL_TABLET | Freq: Every day | ORAL | 1 refills | Status: DC
Start: 2018-04-24 — End: 2018-10-04

## 2018-04-24 MED ORDER — HYDROCHLOROTHIAZIDE 25 MG PO TABS: 25.0000 mg | ORAL_TABLET | Freq: Every day | ORAL | 0 refills | Status: DC

## 2018-04-24 NOTE — Patient Instructions (Addendum)
A few things to remember from today's visit:   Chronic tension-type headache, not intractable  Essential hypertension - Plan: CBC with Differential/Platelet, Comprehensive metabolic panel, TSH, amLODipine (NORVASC) 5 MG tablet, hydrochlorothiazide (HYDRODIURIL) 25 MG tablet  Hyperglycemia - Plan: POCT glycosylated hemoglobin (Hb A1C)  Hypokalemia  No changes today. We will check for diabetes and is negative.   Please be sure medication list is accurate. If a new problem present, please set up appointment sooner than planned today.

## 2018-04-24 NOTE — Progress Notes (Signed)
HPI:   [name] is a 57 y.o. female, who is here today to establish care.  Former PCP: Dr Shawna Orleans Last preventive routine visit: 2-3 years ago.  She does not speak [place], so translator is here to help. Her husband speaks some [place].  Chronic medical problems: HTN   Concerns today: Elevated BP.   Hypertension:    Currently on Amlodipine 5 mg and HCTZ 25 mg,recently resumed (04/19/18).  She is taking medications as instructed, no side effects reported.  She has not noted visual changes, exertional chest pain, dyspnea,  focal weakness, or edema. Home BPs: 110s/80s.  Lab Results  Component Value Date   CREATININE 0.64 09/01/2017   BUN 9 09/01/2017   NA 140 09/01/2017   K 3.0 (L) 09/01/2017   CL 105 09/01/2017   CO2 25 09/01/2017    Lab Results  Component Value Date   TSH 0.77 06/21/2012    Headache: She attributes HA to elevated BP. Headache has improved since she resumed antihypertensives.  She describes headache like something under the skin. Sharp parietal headache.  She has Hx of headaches, listed on PHX.  No associated nausea, vomiting, visual changes, or focal deficit. States that sometimes she feels like she has "dirt" in her eyes.  Last eye exam 3 years ago.   Noted elevated glucose, 184 in 08/2017. No history of diabetes. Denies abdominal pain, nausea,vomiting, polydipsia,polyuria, or polyphagia.   Review of Systems  Constitutional: Positive for fatigue. Negative for activity change, appetite change and fever.  HENT: Negative for mouth sores, nosebleeds and trouble swallowing.   Eyes: Negative for redness and visual disturbance.  Respiratory: Negative for cough, shortness of breath and wheezing.   Cardiovascular: Negative for chest pain, palpitations and leg swelling.  Gastrointestinal: Negative for abdominal pain, nausea and vomiting.       Negative for changes in bowel habits.  Endocrine: Negative for cold intolerance and heat  intolerance.  Genitourinary: Negative for decreased urine volume, dysuria and hematuria.  Skin: Negative for pallor and rash.  Neurological: Positive for dizziness and headaches. Negative for seizures, syncope, weakness and numbness.  Psychiatric/Behavioral: Negative for confusion. The patient is not nervous/anxious.       Current Outpatient Medications on File Prior to Visit  Medication Sig Dispense Refill  . omeprazole (PRILOSEC) 20 MG capsule Take 20 mg daily as needed by mouth (Heart burn).     No current facility-administered medications on file prior to visit.      Past Medical History:  Diagnosis Date  . Asthma    7 yrs ago.  She does not use any inhalers  . GERD (gastroesophageal reflux disease)    EGD performed 15-17 yrs ago in [place]  . Headache    migraines  . Hypertension    No Known Allergies  History reviewed. No pertinent family history.  Social History   Socioeconomic History  . Marital status: Married    Spouse name: Not on file  . Number of children: Not on file  . Years of education: Not on file  . Highest education level: Not on file  Occupational History  . Not on file  Social Needs  . Financial resource strain: Not on file  . Food insecurity:    Worry: Not on file    Inability: Not on file  . Transportation needs:    Medical: Not on file    Non-medical: Not on file  Tobacco Use  . Smoking status: Never Smoker  .  Smokeless tobacco: Never Used  Substance and Sexual Activity  . Alcohol use: No  . Drug use: No  . Sexual activity: Not on file  Lifestyle  . Physical activity:    Days per week: Not on file    Minutes per session: Not on file  . Stress: Not on file  Relationships  . Social connections:    Talks on phone: Not on file    Gets together: Not on file    Attends religious service: Not on file    Active member of club or organization: Not on file    Attends meetings of clubs or organizations: Not on file    Relationship  status: Not on file  Other Topics Concern  . Not on file  Social History Narrative   Housewife   Married for 22 years   3 daughters    Vitals:   04/24/18 1400  BP: 124/83  Pulse: 78  Resp: 12  Temp: 98 F (36.7 C)  SpO2: 98%    Body mass index is 24.24 kg/m.   Physical Exam  Nursing note and vitals reviewed. Constitutional: She is oriented to person, place, and time. She appears well-developed. No distress.  HENT:  Head: Normocephalic and atraumatic.  Mouth/Throat: Oropharynx is clear and moist and mucous membranes are normal.  Eyes: Pupils are equal, round, and reactive to light. Conjunctivae and EOM are normal.  Cardiovascular: Normal rate and regular rhythm.  No murmur heard. Pulses:      Dorsalis pedis pulses are 2+ on the right side, and 2+ on the left side.  Respiratory: Effort normal and breath sounds normal. No respiratory distress.  GI: Soft. She exhibits no mass. There is no hepatomegaly. There is no tenderness.  Musculoskeletal: She exhibits no edema.  Lymphadenopathy:    She has no cervical adenopathy.  Neurological: She is alert and oriented to person, place, and time. She has normal strength. No cranial nerve deficit. Gait normal.  Skin: Skin is warm. No erythema.  Psychiatric: She has a normal mood and affect.  Well groomed, good eye contact.       ASSESSMENT AND PLAN:  Ms. Denise Blackburn was seen today for establish care.  Diagnoses and all orders for this visit:  Lab Results  Component Value Date   TSH 0.83 04/24/2018   Lab Results  Component Value Date   HGBA1C 5.0 04/24/2018   Lab Results  Component Value Date   ALT 12 04/24/2018   AST 17 04/24/2018   ALKPHOS 83 04/24/2018   BILITOT 0.7 04/24/2018   Lab Results  Component Value Date   CREATININE 0.53 04/24/2018   BUN 15 04/24/2018   NA 137 04/24/2018   K 3.0 (L) 04/24/2018   CL 94 (L) 04/24/2018   CO2 34 (H) 04/24/2018   Lab Results  Component Value Date   WBC 7.0 04/24/2018    HGB 14.3 04/24/2018   HCT 42.2 04/24/2018   MCV 78.2 04/24/2018   PLT 269.0 04/24/2018    Chronic tension-type headache, not intractable  Reporting improvement. OTC acetaminophen 500 mg daily as needed for pain. Instructed about warning signs. In the future we could consider amitriptyline, for now she is not interested in taking more medications.  Essential hypertension  Adequately controlled. No changes in current management. DASH diet recommended. Eye exam overdue. F/U in 6 months, before if needed.  -     CBC with Differential/Platelet -     Comprehensive metabolic panel -     TSH -  amLODipine (NORVASC) 5 MG tablet; Take 1 tablet (5 mg total) by mouth daily. -     hydrochlorothiazide (HYDRODIURIL) 25 MG tablet; Take 1 tablet (25 mg total) by mouth daily.  Hyperglycemia  A1c in normal range. Healthy lifestyle recommended for primary prevention.  -     POCT glycosylated hemoglobin (Hb A1C)  Hypokalemia  Further recommendation will be given according to lab results. No changes in HCTZ for now.  -     Comprehensive metabolic panel       Stormy Connon G. Martinique, MD  [facility]. [place] office.

## 2018-04-30 ENCOUNTER — Other Ambulatory Visit: Payer: Self-pay | Admitting: *Deleted

## 2018-04-30 DIAGNOSIS — Z8639 Personal history of other endocrine, nutritional and metabolic disease: Secondary | ICD-10-CM

## 2018-04-30 MED ORDER — POTASSIUM CHLORIDE 20 MEQ PO PACK: 20.0000 meq | PACK | Freq: Two times a day (BID) | ORAL | 0 refills | Status: DC

## 2018-05-01 ENCOUNTER — Telehealth: Payer: Self-pay | Admitting: Family Medicine

## 2018-05-01 ENCOUNTER — Ambulatory Visit: Payer: BLUE CROSS/BLUE SHIELD | Admitting: Family Medicine

## 2018-05-01 NOTE — Telephone Encounter (Signed)
Spoke with patient with daughter as Optometrist and informed her that she is supposed to take the amlodipine, the HCTZ was d/c and she was supposed to pick up potassium that was sent to pharmacy on 04/30/18. Called pharmacy to confirm that Rx for K-Dur was received and she stated it was ready for pick up. Called daughter back and informed her to pick up potassium and take insurance card with her and she verbalized understanding.

## 2018-05-01 NOTE — Telephone Encounter (Unsigned)
Copied from [place] [id number]. Topic: Quick Communication - See Telephone Encounter >> May 01, 2018  2:00 PM Neva Seat wrote: Pt picked up 2 medications from [facility] today Hydrochlorothiazide amlodipine besylate  Wants to know if she is supposed to take both medications.  Please call pt to discuss.

## 2018-05-25 ENCOUNTER — Other Ambulatory Visit: Payer: Self-pay | Admitting: Family Medicine

## 2018-05-25 DIAGNOSIS — Z8639 Personal history of other endocrine, nutritional and metabolic disease: Secondary | ICD-10-CM

## 2018-05-28 ENCOUNTER — Telehealth: Payer: Self-pay | Admitting: Family Medicine

## 2018-05-28 NOTE — Telephone Encounter (Signed)
Pt is in the office requesting a refill of potassium chloride 20 MEQ she did not understand that she was only to take one packet bid.  Pt was taking two packets of the medication instead of 1 packet of the 60 day supply and now has only 12 packets left.   Pharm: [facility] on [address]

## 2018-05-28 NOTE — Telephone Encounter (Signed)
Message sent to Dr. Jordan for review and approval. 

## 2018-06-01 NOTE — Telephone Encounter (Signed)
I need clarification in regard to KCL. Can she swallow tablets? I recommended potassium chloride 20 mEq twice daily because potassium is moderately low. She can have tablets instead powder unless she requested otherwise.  Thanks, BK

## 2018-06-04 ENCOUNTER — Other Ambulatory Visit: Payer: Self-pay | Admitting: *Deleted

## 2018-06-04 MED ORDER — POTASSIUM CHLORIDE ER 20 MEQ PO TBCR: 1.0000 | EXTENDED_RELEASE_TABLET | Freq: Two times a day (BID) | ORAL | 3 refills | Status: DC

## 2018-06-04 NOTE — Telephone Encounter (Signed)
Rx for tablets sent to the pharmacy.

## 2018-06-05 ENCOUNTER — Ambulatory Visit: Payer: Self-pay | Admitting: *Deleted

## 2018-06-05 NOTE — Telephone Encounter (Signed)
Pt's daughter, Ronney Asters, called to clarify which potassium the pt should be taking; per prescription per Dr Martinique, dated 06/04/18; potassium chloride ER 20 MEQ TBCR 1 tablet, 2 times daily;  she verbalizes understanding.        Reason for Disposition . Caller has medication question only, adult not sick, and triager answers question  Answer Assessment - Initial Assessment Questions 1. SYMPTOMS: "Do you have any symptoms?"     Pt's daughter called to find out which potassium should her mother take pills or powder 2. SEVERITY: If symptoms are present, ask "Are they mild, moderate or severe?"     n/a  Protocols used: MEDICATION QUESTION CALL-A-AH

## 2018-07-14 IMAGING — US US ABDOMEN COMPLETE
1 series · 14 of 25 positions shown · non-contrast
Comparison: 02/16/2010

CLINICAL DATA: Right upper quadrant abdominal pain

EXAM:
ABDOMEN ULTRASOUND COMPLETE

[Series 1: us abdomen complete · 0.22mm/px · 14 of 48 slices shown]
[im 1/48]
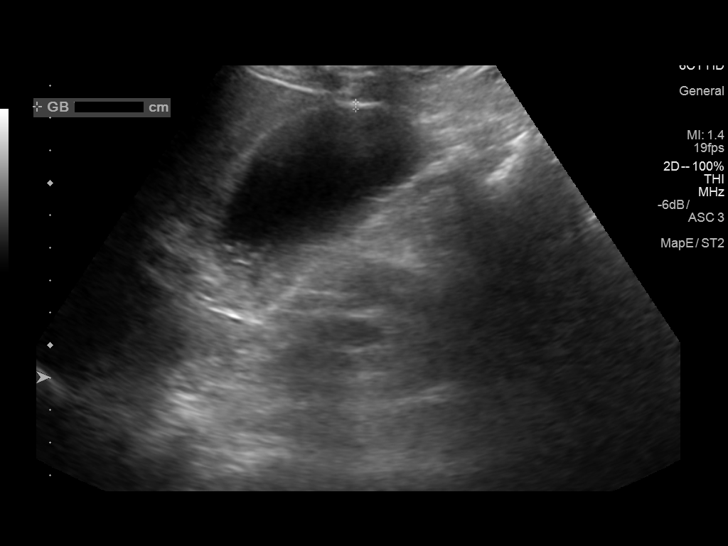
[im 4/48]
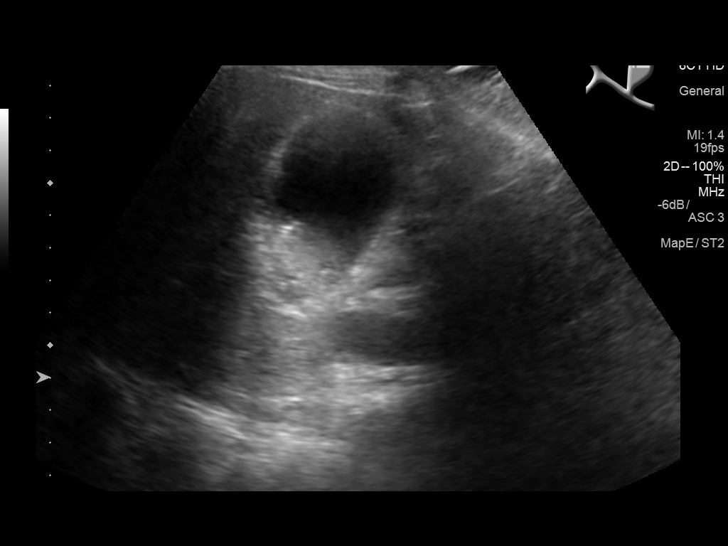
[im 8/48]
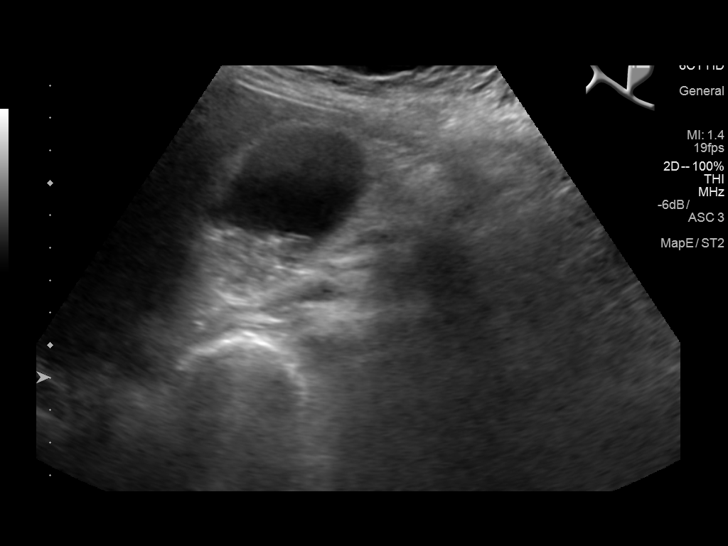
[im 12/48]
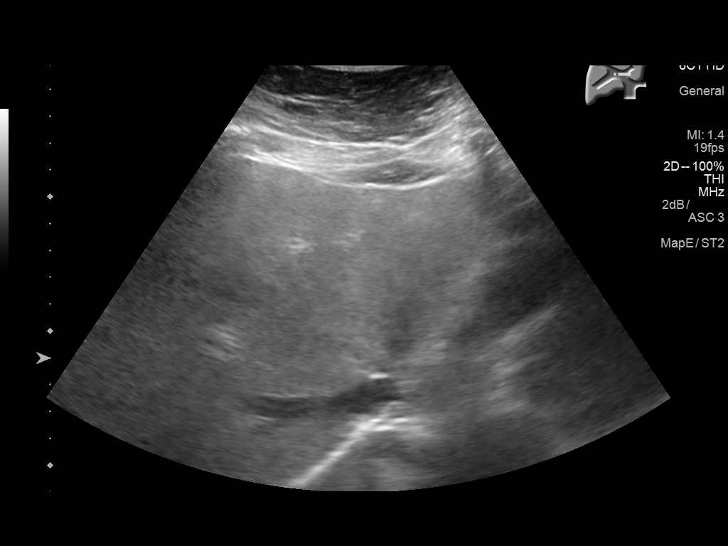
[im 16/48]
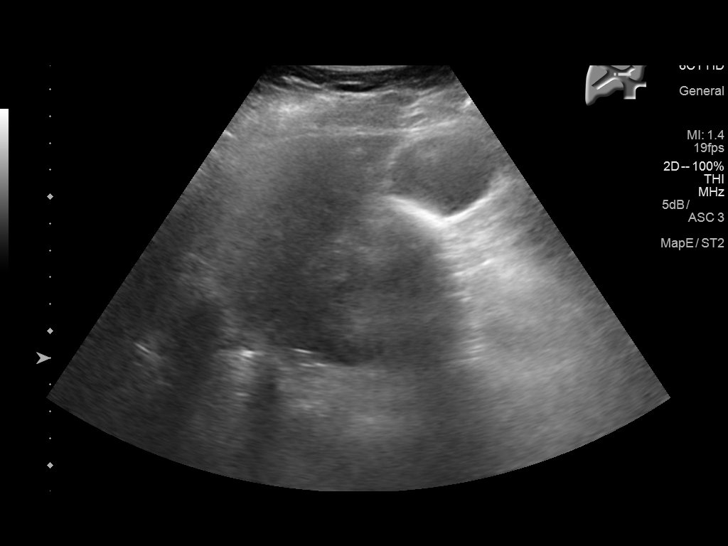
[im 18/48]
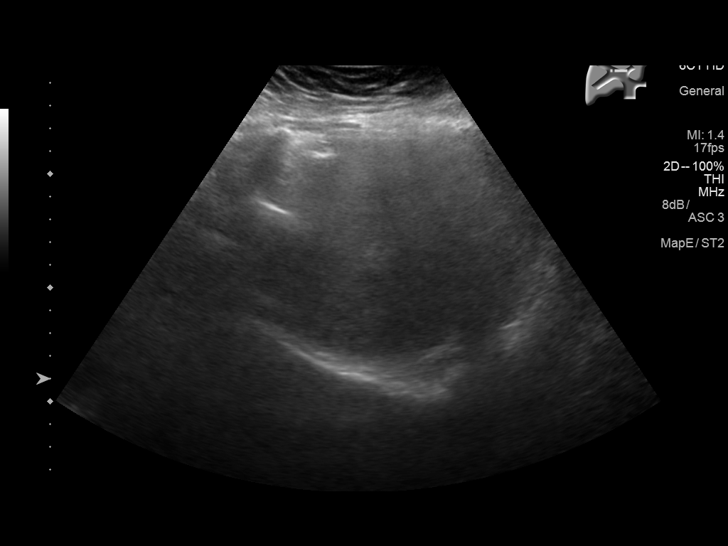
[im 22/48]
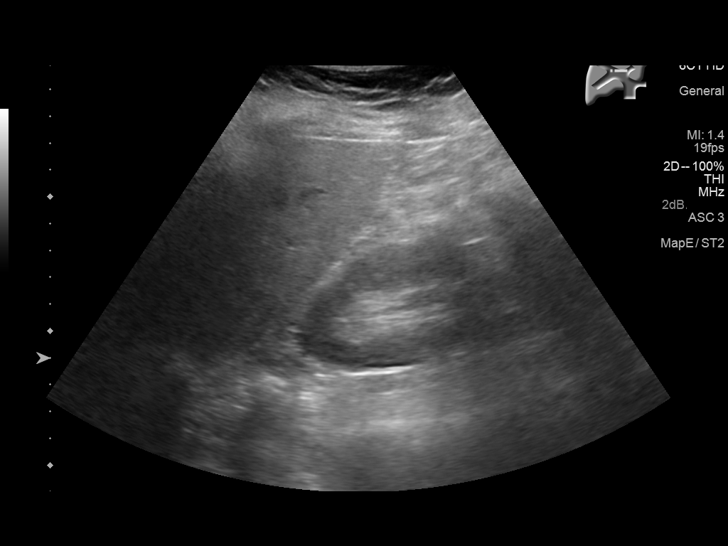
[im 26/48]
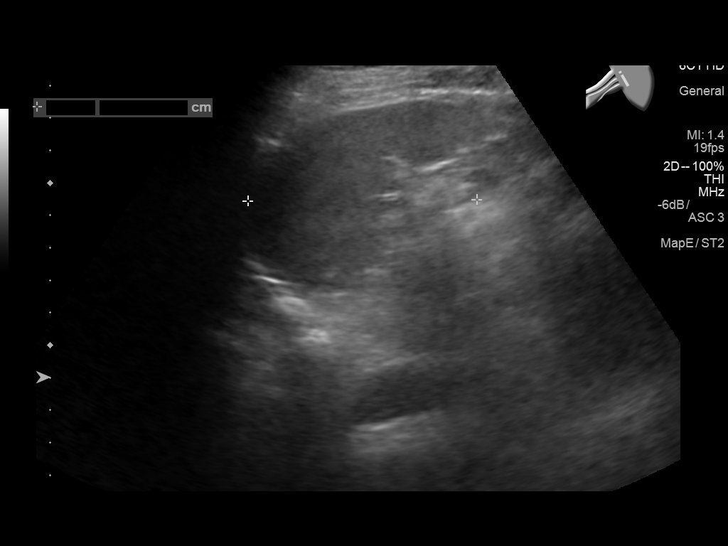
[im 30/48]
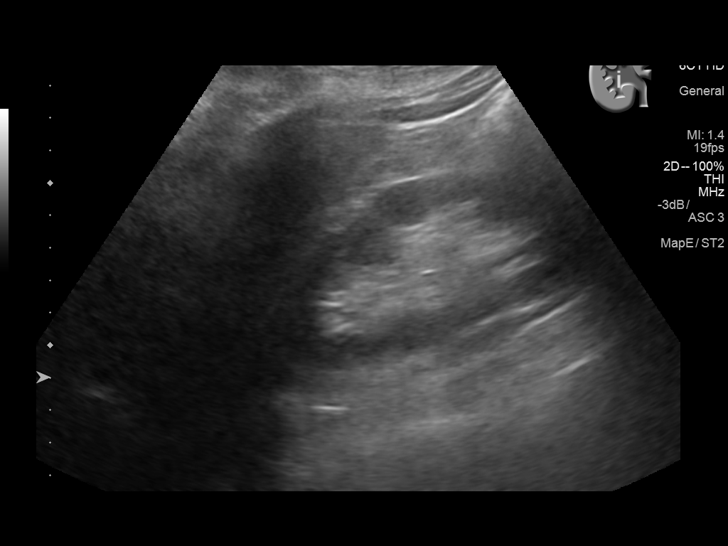
[im 32/48]
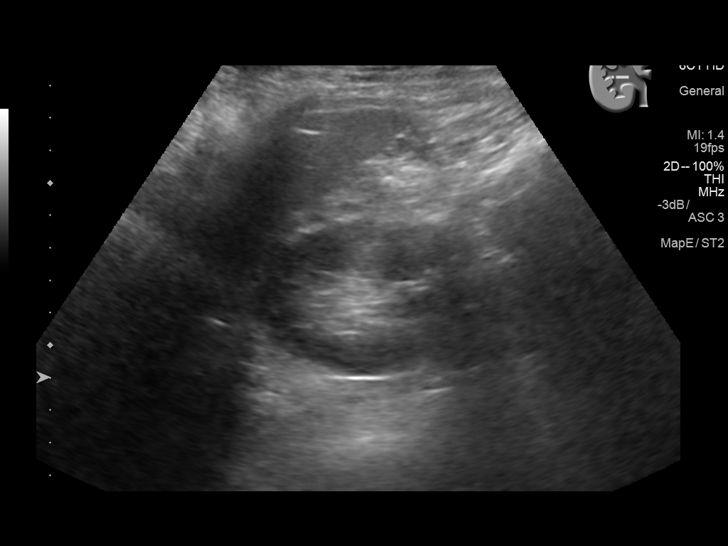
[im 36/48]
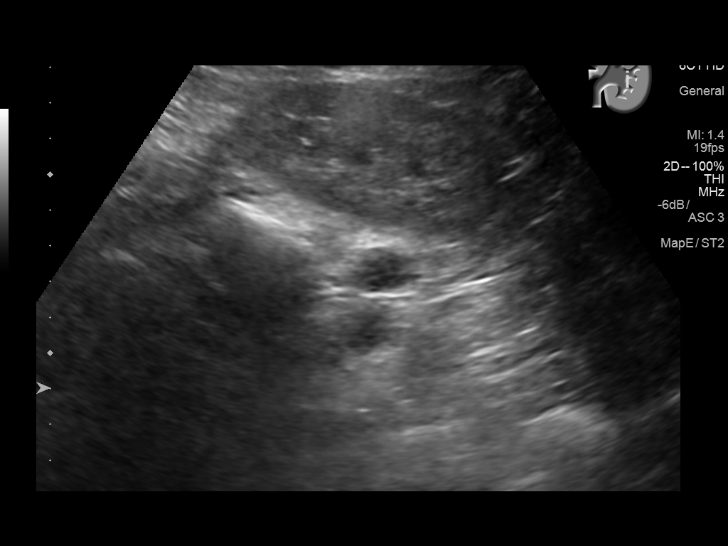
[im 40/48]
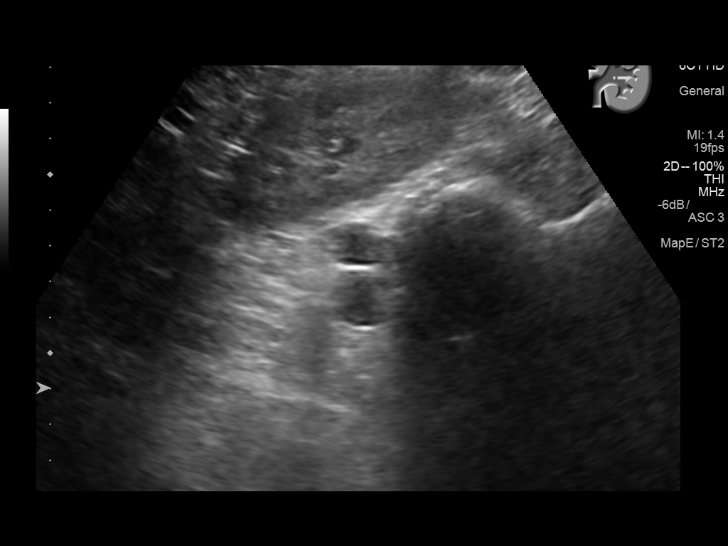
[im 44/48]
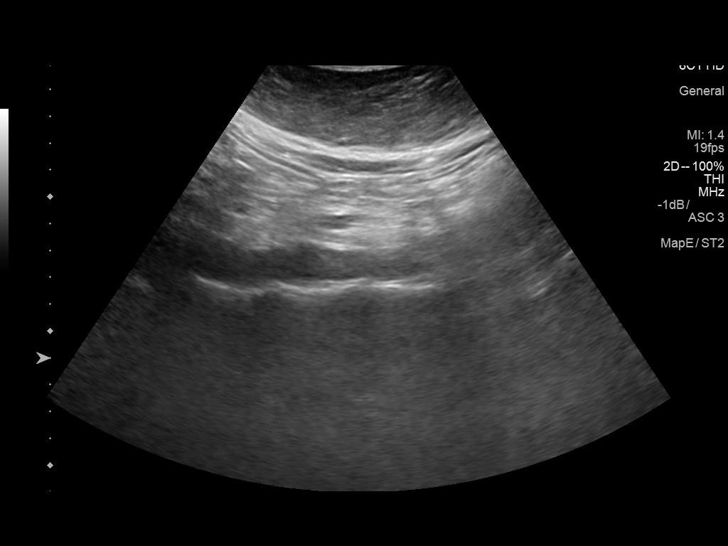
[im 48/48]
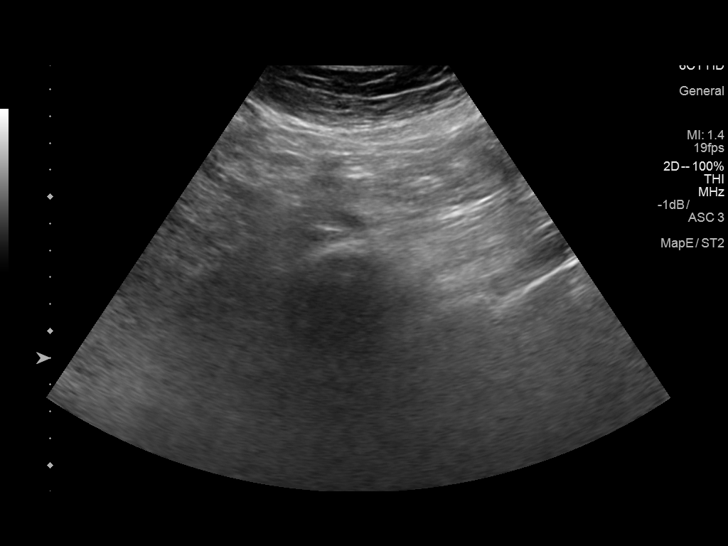

[14 of 25 positions shown; findings below may reference images not displayed]

FINDINGS: Gallbladder: Multiple stones and sludge, stones measure up to 3 mm.
Negative sonographic Murphy. Normal wall thickness.

Common bile duct: Diameter: 2.4 mm

Liver: Increased echogenicity. No focal hepatic abnormality. Portal
vein is patent on color Doppler imaging with normal direction of
blood flow towards the liver.

IVC: No abnormality visualized.

Pancreas: Not well seen due to gas

Spleen: Size and appearance within normal limits.

Right Kidney: Length: 10.7 cm. Echogenicity within normal limits. No
mass or hydronephrosis visualized.

Left Kidney: Length: 10.9 cm. Echogenicity within normal limits. No
mass or hydronephrosis visualized.

Abdominal aorta: No aneurysm visualized.

Other findings: None.
IMPRESSION: 1. Cholelithiasis/gallbladder sludge without sonographic evidence
for acute cholecystitis or biliary dilatation
2. Increased liver echogenicity consistent with fatty change

## 2018-07-23 ENCOUNTER — Other Ambulatory Visit: Payer: Self-pay | Admitting: Family Medicine

## 2018-07-23 DIAGNOSIS — I10 Essential (primary) hypertension: Secondary | ICD-10-CM

## 2018-07-26 ENCOUNTER — Other Ambulatory Visit: Payer: Self-pay | Admitting: Family Medicine

## 2018-07-26 DIAGNOSIS — I10 Essential (primary) hypertension: Secondary | ICD-10-CM

## 2018-07-31 ENCOUNTER — Other Ambulatory Visit: Payer: Self-pay | Admitting: Family Medicine

## 2018-07-31 ENCOUNTER — Telehealth: Payer: Self-pay | Admitting: Family Medicine

## 2018-07-31 DIAGNOSIS — I10 Essential (primary) hypertension: Secondary | ICD-10-CM

## 2018-07-31 NOTE — Telephone Encounter (Signed)
Copied from [place] [id number]. Topic: Quick Communication - Rx Refill/Question >> Jul 31, 2018 12:33 PM Reyne Dumas L wrote: Pt calling in.  States that she has been getting lots of texts from her pharmacy to pick up medications.  Pt is now confused about what medications she should be taking and would like for someone to call and review this with her. Pt can be reached at [phone]

## 2018-08-01 NOTE — Telephone Encounter (Signed)
Pt calling to check status on hearing back about if she is taking her medications correctly.

## 2018-08-01 NOTE — Telephone Encounter (Signed)
Tried returning call, no answer. Left detailed message for patient letting her know that at her visit on 08/03/18 she can discuss medications with PCP.

## 2018-08-02 NOTE — Progress Notes (Signed)
HPI:   Denise Blackburn is a 57 y.o. female, who is here today with her daughter for 3 months follow up.   She is from [place] and does not speak English, her daughter helps me with interrogation.  She was last seen on 04/24/18  Hypertension:  Currently on Amlodipine 5 mg daily. She is not taking HCTZ 25 mg. Home BP readings: Not checking. She is taking medications as instructed, no side effects reported.  She has not noted unusual headache, visual changes, exertional chest pain, dyspnea,  focal weakness, or edema.   Lab Results  Component Value Date   CREATININE 0.53 04/24/2018   BUN 15 04/24/2018   NA 137 04/24/2018   K 3.0 (L) 04/24/2018   CL 94 (L) 04/24/2018   CO2 34 (H) 04/24/2018    HypoK+, she is on KCL 20 meq daily.  Headache: Last visit she was c/o sharp parietal headache, "under skin."  Still having problem, global headache,intermittentlyu for years. She attributes this to elevated BP, 140/80's. States that BP goes up right after food intake.  Dull,6-7/10.  Left-sided cervical pain radiated to LUE with mild tingling sensation, ,intermittently for 5 years. She has not identified exacerbating. Local massage helps with pain. Occasional dizziness. No associated visual changes,photophobia,nausea,or vomiting.   She has not tried OTC medications.   Review of Systems  Constitutional: Positive for fatigue. Negative for activity change, appetite change and fever.  HENT: Negative for mouth sores, nosebleeds and trouble swallowing.   Eyes: Negative for redness and visual disturbance.  Respiratory: Negative for cough, shortness of breath and wheezing.   Cardiovascular: Negative for chest pain, palpitations and leg swelling.  Gastrointestinal: Negative for abdominal pain, nausea and vomiting.       Negative for changes in bowel habits.  Endocrine: Negative for cold intolerance and heat intolerance.  Genitourinary: Negative for decreased urine volume,  dysuria and hematuria.  Musculoskeletal: Positive for neck pain. Negative for gait problem.  Skin: Negative for color change and rash.  Neurological: Positive for numbness and headaches. Negative for syncope and weakness.  Psychiatric/Behavioral: Positive for sleep disturbance. The patient is nervous/anxious.      Current Outpatient Medications on File Prior to Visit  Medication Sig Dispense Refill  . amLODipine (NORVASC) 5 MG tablet Take 1 tablet (5 mg total) by mouth daily. 90 tablet 1  . omeprazole (PRILOSEC) 20 MG capsule Take 20 mg daily as needed by mouth (Heart burn).    . Potassium Chloride ER 20 MEQ TBCR Take 1 tablet by mouth 2 (two) times daily. 60 tablet 3   No current facility-administered medications on file prior to visit.      Past Medical History:  Diagnosis Date  . Asthma    7 yrs ago.  She does not use any inhalers  . GERD (gastroesophageal reflux disease)    EGD performed 15-17 yrs ago in [place]  . Headache    migraines  . Hypertension    No Known Allergies  Social History   Socioeconomic History  . Marital status: Married    Spouse name: Not on file  . Number of children: Not on file  . Years of education: Not on file  . Highest education level: Not on file  Occupational History  . Not on file  Social Needs  . Financial resource strain: Not on file  . Food insecurity:    Worry: Not on file    Inability: Not on file  . Transportation needs:  Medical: Not on file    Non-medical: Not on file  Tobacco Use  . Smoking status: Never Smoker  . Smokeless tobacco: Never Used  Substance and Sexual Activity  . Alcohol use: No  . Drug use: No  . Sexual activity: Not on file  Lifestyle  . Physical activity:    Days per week: Not on file    Minutes per session: Not on file  . Stress: Not on file  Relationships  . Social connections:    Talks on phone: Not on file    Gets together: Not on file    Attends religious service: Not on file    Active  member of club or organization: Not on file    Attends meetings of clubs or organizations: Not on file    Relationship status: Not on file  Other Topics Concern  . Not on file  Social History Narrative   Housewife   Married for 22 years   3 daughters    Vitals:   08/03/18 0842  BP: 122/84  Pulse: 68  Resp: 12  Temp: 98.5 F (36.9 C)  SpO2: 99%   Body mass index is 26.83 kg/m.   Physical Exam  Nursing note and vitals reviewed. Constitutional: She is oriented to person, place, and time. She appears well-developed and well-nourished. No distress.  HENT:  Head: Normocephalic and atraumatic.  Mouth/Throat: Oropharynx is clear and moist and mucous membranes are normal.  Eyes: Pupils are equal, round, and reactive to light. Conjunctivae are normal.  Neck: No tracheal deviation present. Thyromegaly (? Nodule) present.  Cardiovascular: Normal rate and regular rhythm.  No murmur heard. Pulses:      Dorsalis pedis pulses are 2+ on the right side, and 2+ on the left side.  Respiratory: Effort normal and breath sounds normal. No respiratory distress.  GI: Soft. She exhibits no mass. There is no hepatomegaly. There is no tenderness.  Musculoskeletal: She exhibits no edema.       Cervical back: She exhibits decreased range of motion (minimal). She exhibits no tenderness and no bony tenderness.  Lymphadenopathy:    She has no cervical adenopathy.  Neurological: She is alert and oriented to person, place, and time. She has normal strength. No cranial nerve deficit. Gait normal.  Skin: Skin is warm. No rash noted. No erythema.  Psychiatric: Her mood appears anxious.  Well groomed, good eye contact.     ASSESSMENT AND PLAN:   Denise Blackburn was seen today for follow-up.  Orders Placed This Encounter  Procedures  . MM 3D SCREEN BREAST BILATERAL  . US THYROID  . Vitamin B12  . Basic metabolic panel  . TSH   Lab Results  Component Value Date   TSH 0.83 04/24/2018   Lab  Results  Component Value Date   [id number] 562 08/03/2018   Lab Results  Component Value Date   CREATININE 0.58 08/03/2018   BUN 10 08/03/2018   NA 141 08/03/2018   K 4.2 08/03/2018   CL 104 08/03/2018   CO2 29 08/03/2018     Headache, unspecified headache type We discussed possible etiologies, I still think it is tension-like headache. We discussed pharmacologic treatment options. She agrees with trying amitriptyline 25 mg daily, we discussed side effects. Instructed about warning signs. I do not think imaging is needed at this time.  HTN (hypertension) Adequately controlled. No changes seen Amlodipine 5 mg or HCTZ 25 mg daily. Recommend monitoring BP at home. Low-salt diet to continue.  Annual eye exam also recommended. Follow-up in 4 to 6 months.  Hypokalemia Continue potassium chloride 20 mEq daily. Further recommendations will be given according to BMP results.  Cervicalgia This problem could be contributing to headache. ?  Radicular. For now I am not recommending cervical MRI, she had one in 04/2017. Instructed about warning signs. Local massage and OTC Aspercreme with lidocaine may help.  Amitriptyline may help. Follow-up in 4 weeks.   Numbness and tingling in left arm  Possible etiologies discussed. ? Radicular pain. Cervical MRI 04/2017 minimal degenerative changes. For now I do not recommend repeating imaging. May consider neuro referral if not improvement with Amitriptyline.   - Vitamin V67 - Basic metabolic panel  Breast cancer screening - MM 3D SCREEN BREAST BILATERAL; Future  Enlarged thyroid gland Further recommendations will be given according to labs/imaging results.  - TSH - US THYROID; Future      Jakyri Brunkhorst G. [place], MD  [facility]. [place] office.

## 2018-08-03 ENCOUNTER — Ambulatory Visit: Payer: BLUE CROSS/BLUE SHIELD | Admitting: Family Medicine

## 2018-08-03 ENCOUNTER — Encounter: Payer: Self-pay | Admitting: Family Medicine

## 2018-08-03 VITALS — BP 122/84 | HR 68 | Temp 98.5°F | Resp 12 | Ht 60.0 in | Wt 137.4 lb

## 2018-08-03 DIAGNOSIS — Z1239 Encounter for other screening for malignant neoplasm of breast: Secondary | ICD-10-CM

## 2018-08-03 DIAGNOSIS — E876 Hypokalemia: Secondary | ICD-10-CM | POA: Diagnosis not present

## 2018-08-03 DIAGNOSIS — R200 Anesthesia of skin: Secondary | ICD-10-CM | POA: Diagnosis not present

## 2018-08-03 DIAGNOSIS — R519 Headache, unspecified: Secondary | ICD-10-CM

## 2018-08-03 DIAGNOSIS — R51 Headache: Secondary | ICD-10-CM | POA: Diagnosis not present

## 2018-08-03 DIAGNOSIS — E049 Nontoxic goiter, unspecified: Secondary | ICD-10-CM

## 2018-08-03 DIAGNOSIS — M542 Cervicalgia: Secondary | ICD-10-CM | POA: Insufficient documentation

## 2018-08-03 DIAGNOSIS — I10 Essential (primary) hypertension: Secondary | ICD-10-CM

## 2018-08-03 DIAGNOSIS — R202 Paresthesia of skin: Secondary | ICD-10-CM | POA: Diagnosis not present

## 2018-08-03 LAB — VITAMIN B12: Vitamin B-12: 683 pg/mL (ref 211–911)

## 2018-08-03 LAB — BASIC METABOLIC PANEL
BUN: 10 mg/dL (ref 6–23)
CHLORIDE: 104 meq/L (ref 96–112)
CO2: 29 mEq/L (ref 19–32)
Calcium: 9.7 mg/dL (ref 8.4–10.5)
Creatinine, Ser: 0.58 mg/dL (ref 0.40–1.20)
GFR: 113.49 mL/min (ref >60.00)
Glucose, Bld: 100 mg/dL — ABNORMAL HIGH (ref 70–99)
POTASSIUM: 4.2 meq/L (ref 3.5–5.1)
Sodium: 141 mEq/L (ref 135–145)

## 2018-08-03 MED ORDER — AMITRIPTYLINE HCL 25 MG PO TABS: 25.0000 mg | ORAL_TABLET | Freq: Every day | ORAL | 1 refills | Status: DC

## 2018-08-03 NOTE — Patient Instructions (Signed)
A few things to remember from today's visit:   Essential hypertension - Plan: Basic metabolic panel  Headache, unspecified headache type - Plan: amitriptyline (ELAVIL) 25 MG tablet  Hypokalemia - Plan: Basic metabolic panel  Cervicalgia  Numbness and tingling in left arm - Plan: Vitamin X46, Basic metabolic panel  Breast cancer screening - Plan: MM 3D SCREEN BREAST BILATERAL  Amitriptyline added today.   Please be sure medication list is accurate. If a new problem present, please set up appointment sooner than planned today.

## 2018-08-03 NOTE — Assessment & Plan Note (Addendum)
This problem could be contributing to headache. ?  Radicular. For now I am not recommending cervical MRI, she had one in 04/2017. Instructed about warning signs. Local massage and OTC Aspercreme with lidocaine may help.  Amitriptyline may help. Follow-up in 4 weeks.

## 2018-08-03 NOTE — Assessment & Plan Note (Signed)
Adequately controlled. No changes seen Amlodipine 5 mg or HCTZ 25 mg daily. Recommend monitoring BP at home. Low-salt diet to continue. Annual eye exam also recommended. Follow-up in 4 to 6 months.

## 2018-08-03 NOTE — Assessment & Plan Note (Signed)
Continue potassium chloride 20 mEq daily. Further recommendations will be given according to BMP results.

## 2018-08-03 NOTE — Assessment & Plan Note (Signed)
We discussed possible etiologies, I still think it is tension-like headache. We discussed pharmacologic treatment options. She agrees with trying amitriptyline 25 mg daily, we discussed side effects. Instructed about warning signs. I do not think imaging is needed at this time.

## 2018-08-08 ENCOUNTER — Other Ambulatory Visit: Payer: Self-pay | Admitting: *Deleted

## 2018-08-08 MED ORDER — POTASSIUM CHLORIDE ER 20 MEQ PO TBCR: 1.0000 | EXTENDED_RELEASE_TABLET | Freq: Two times a day (BID) | ORAL | 3 refills | Status: DC

## 2018-08-13 ENCOUNTER — Telehealth: Payer: Self-pay | Admitting: *Deleted

## 2018-08-13 NOTE — Addendum Note (Signed)
Addended by: [place], Addalee Kavanagh G on: 08/13/2018 06:23 PM   Modules accepted: Orders

## 2018-08-13 NOTE — Telephone Encounter (Signed)
Copied from [place] [id number]. Topic: Quick Communication - Lab Results (Clinic Use ONLY) >> Aug 07, 2018  4:45 PM Denise Blackburn, CMA wrote: Called patient to inform them of their lab results. When patient returns call, triage nurse may disclose results.

## 2018-08-25 ENCOUNTER — Other Ambulatory Visit: Payer: Self-pay | Admitting: Family Medicine

## 2018-08-25 DIAGNOSIS — R519 Headache, unspecified: Secondary | ICD-10-CM

## 2018-08-25 DIAGNOSIS — R51 Headache: Principal | ICD-10-CM

## 2018-08-27 ENCOUNTER — Ambulatory Visit: Payer: BLUE CROSS/BLUE SHIELD | Admitting: Family Medicine

## 2018-10-04 ENCOUNTER — Other Ambulatory Visit: Payer: Self-pay | Admitting: Family Medicine

## 2018-10-04 DIAGNOSIS — I10 Essential (primary) hypertension: Secondary | ICD-10-CM

## 2018-10-25 ENCOUNTER — Other Ambulatory Visit: Payer: Self-pay | Admitting: Family Medicine

## 2018-10-25 DIAGNOSIS — I10 Essential (primary) hypertension: Secondary | ICD-10-CM

## 2018-10-25 NOTE — Telephone Encounter (Signed)
Copied from [place] [id number]. Topic: Quick Communication - Rx Refill/Question >> Oct 25, 2018  3:46 PM Keene Breath wrote: Medication: amLODipine (NORVASC) 5 MG tablet  Patient called to request a refill for the above medication  Preferred Pharmacy (with phone number or street name): [id number] - [place], [place]. AT [address] [phone] (Phone) [phone] (Fax)

## 2018-10-26 MED ORDER — AMLODIPINE BESYLATE 5 MG PO TABS: 5.0000 mg | ORAL_TABLET | Freq: Every day | ORAL | 1 refills | Status: DC

## 2018-11-23 DIAGNOSIS — H938X2 Other specified disorders of left ear: Secondary | ICD-10-CM | POA: Diagnosis not present

## 2018-11-23 DIAGNOSIS — H9042 Sensorineural hearing loss, unilateral, left ear, with unrestricted hearing on the contralateral side: Secondary | ICD-10-CM | POA: Diagnosis not present

## 2019-01-12 DIAGNOSIS — Z79899 Other long term (current) drug therapy: Secondary | ICD-10-CM | POA: Diagnosis not present

## 2019-01-12 DIAGNOSIS — E663 Overweight: Secondary | ICD-10-CM | POA: Diagnosis not present

## 2019-01-12 DIAGNOSIS — B028 Zoster with other complications: Secondary | ICD-10-CM | POA: Diagnosis not present

## 2019-01-12 DIAGNOSIS — R21 Rash and other nonspecific skin eruption: Secondary | ICD-10-CM | POA: Diagnosis not present

## 2019-01-12 DIAGNOSIS — Z789 Other specified health status: Secondary | ICD-10-CM | POA: Diagnosis not present

## 2019-02-21 ENCOUNTER — Other Ambulatory Visit: Payer: Self-pay | Admitting: Family Medicine

## 2019-02-21 DIAGNOSIS — I10 Essential (primary) hypertension: Secondary | ICD-10-CM

## 2019-02-22 ENCOUNTER — Other Ambulatory Visit: Payer: Self-pay

## 2019-02-22 ENCOUNTER — Ambulatory Visit (INDEPENDENT_AMBULATORY_CARE_PROVIDER_SITE_OTHER): Payer: BLUE CROSS/BLUE SHIELD | Admitting: Family Medicine

## 2019-02-22 ENCOUNTER — Encounter: Payer: Self-pay | Admitting: Family Medicine

## 2019-02-22 DIAGNOSIS — R519 Headache, unspecified: Secondary | ICD-10-CM

## 2019-02-22 DIAGNOSIS — R51 Headache: Secondary | ICD-10-CM | POA: Diagnosis not present

## 2019-02-22 DIAGNOSIS — I10 Essential (primary) hypertension: Secondary | ICD-10-CM | POA: Diagnosis not present

## 2019-02-22 NOTE — Assessment & Plan Note (Signed)
She is not sure about BP readings. For now I recommend continuing with amlodipine 5 mg daily. Monitor BP daily for 2 weeks and let me know about BP readings. Continue low-salt diet.

## 2019-02-22 NOTE — Progress Notes (Signed)
Virtual Visit via Video Note   I connected with Ms Medlin's daughter on 02/22/19 at  9:00 AM EDT by a video enabled telemedicine application and verified that I am speaking with the correct person using two identifiers.  Location patient: home Location provider:work office Persons participating in the virtual visit: patient, provider  I discussed the limitations of evaluation and management by telemedicine and the availability of in person appointments.  Patient's daughter expressed understanding and agreed to proceed.   HPI: Ms Lietzke is a 11 [place] female, who does not speak [place], so today daughter is providing history. Ms Dubuc is not present,daughter did not feel is was necessary.  According to the daughter, BPs are "high-and-low", she is not sure about BP readings.  It seemed that BP is elevated when she eats certain foods, her daughter is not sure about what type of food is causing BP elevation, she states that she is following low-salt diet.  Negative for visual changes, chest pain, dyspnea, palpitation, claudication, focal weakness, or edema.  She is still having daily headaches,according to daughter problem mildly improved "when she takes medication." Last visit amitriptyline 25 mg daily at night was recommended.Also neurology referral was placed, apparently we were not able to reach patient.  ROS: See pertinent positives and negatives per HPI.  Past Medical History:  Diagnosis Date  . Asthma    7 yrs ago.  She does not use any inhalers  . GERD (gastroesophageal reflux disease)    EGD performed 15-17 yrs ago in [place]  . Headache    migraines  . Hypertension     Past Surgical History:  Procedure Laterality Date  . CHOLECYSTECTOMY N/A 10/26/2017   Procedure: LAPAROSCOPIC CHOLECYSTECTOMY;  Surgeon: Georganna Skeans, MD;  Location: [facility];  Service: General;  Laterality: N/A;    No family history on file.  Social History   Socioeconomic History  . Marital status:  Married    Spouse name: Not on file  . Number of children: Not on file  . Years of education: Not on file  . Highest education level: Not on file  Occupational History  . Not on file  Social Needs  . Financial resource strain: Not on file  . Food insecurity:    Worry: Not on file    Inability: Not on file  . Transportation needs:    Medical: Not on file    Non-medical: Not on file  Tobacco Use  . Smoking status: Never Smoker  . Smokeless tobacco: Never Used  Substance and Sexual Activity  . Alcohol use: No  . Drug use: No  . Sexual activity: Not on file  Lifestyle  . Physical activity:    Days per week: Not on file    Minutes per session: Not on file  . Stress: Not on file  Relationships  . Social connections:    Talks on phone: Not on file    Gets together: Not on file    Attends religious service: Not on file    Active member of club or organization: Not on file    Attends meetings of clubs or organizations: Not on file    Relationship status: Not on file  . Intimate partner violence:    Fear of current or ex partner: Not on file    Emotionally abused: Not on file    Physically abused: Not on file    Forced sexual activity: Not on file  Other Topics Concern  . 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  amitriptyline (ELAVIL) 25 MG tablet, TAKE 1 TABLET BY MOUTH EVERYDAY AT BEDTIME, Disp: 90 tablet, Rfl: 1 .  amLODipine (NORVASC) 5 MG tablet, TAKE 1 TABLET BY MOUTH EVERY DAY, Disp: 90 tablet, Rfl: 1 .  omeprazole (PRILOSEC) 20 MG capsule, Take 20 mg daily as needed by mouth (Heart burn)., Disp: , Rfl:   EXAM:  VITALS per patient if applicable:N/A  ASSESSMENT AND PLAN:  Discussed the following assessment and plan:  Orders Placed This Encounter  Procedures  . Ambulatory referral to Neurology    Headache, unspecified headache type Questions about medication  compliance. According to the daughter, headache has improved but still having daily episodes. We discussed options, she agrees with increasing amitriptyline from 25 mg to 50 mg at bedtime.  We discussed some side effects of medication. Another neurology referral was placed. Instructed about warning signs.   HTN (hypertension) She is not sure about BP readings. For now I recommend continuing with amlodipine 5 mg daily. Monitor BP daily for 2 weeks and let me know about BP readings. Continue low-salt diet.   I discussed the assessment and treatment plan with the patient. The patient was provided an opportunity to ask questions and all were answered. The patient agreed with the plan and demonstrated an understanding of the instructions.   The patient was advised to call back or seek an in-person evaluation if the symptoms worsen or if the condition fails to improve as anticipated.  Return in about 4 months (around 06/25/2019) for HTN.     [place], MD

## 2019-02-22 NOTE — Assessment & Plan Note (Signed)
Questions about medication compliance. According to the daughter, headache has improved but still having daily episodes. We discussed options, she agrees with increasing amitriptyline from 25 mg to 50 mg at bedtime.  We discussed some side effects of medication. Another neurology referral was placed. Instructed about warning signs.

## 2019-02-22 NOTE — Telephone Encounter (Signed)
Follow-up appointment scheduled for today.

## 2019-04-02 ENCOUNTER — Encounter: Payer: Self-pay | Admitting: Neurology

## 2019-04-12 ENCOUNTER — Ambulatory Visit: Payer: BLUE CROSS/BLUE SHIELD | Admitting: Neurology

## 2019-12-21 ENCOUNTER — Other Ambulatory Visit: Payer: Self-pay | Admitting: Family Medicine

## 2019-12-21 DIAGNOSIS — I10 Essential (primary) hypertension: Secondary | ICD-10-CM

## 2020-03-15 ENCOUNTER — Other Ambulatory Visit: Payer: Self-pay | Admitting: Family Medicine

## 2020-03-15 DIAGNOSIS — I10 Essential (primary) hypertension: Secondary | ICD-10-CM

## 2020-03-16 NOTE — Telephone Encounter (Signed)
Patient need to schedule an ov for more refills. 

## 2020-03-17 ENCOUNTER — Telehealth: Payer: Self-pay | Admitting: Family Medicine

## 2020-03-17 ENCOUNTER — Other Ambulatory Visit: Payer: Self-pay | Admitting: *Deleted

## 2020-03-17 DIAGNOSIS — I10 Essential (primary) hypertension: Secondary | ICD-10-CM

## 2020-03-17 MED ORDER — AMLODIPINE BESYLATE 5 MG PO TABS: 5.0000 mg | ORAL_TABLET | Freq: Every day | ORAL | 0 refills | Status: DC

## 2020-03-17 NOTE — Telephone Encounter (Signed)
Pt is calling in stating that she is out of Rx amlodipine 5 MG she has an appointment 04/21/2020 for a CPE.  Pharm:  [facility]

## 2020-03-17 NOTE — Telephone Encounter (Signed)
30 day supply sent to the pharmacy until CPE.

## 2020-04-21 ENCOUNTER — Ambulatory Visit (INDEPENDENT_AMBULATORY_CARE_PROVIDER_SITE_OTHER): Payer: 59 | Admitting: Family Medicine

## 2020-04-21 ENCOUNTER — Encounter: Payer: Self-pay | Admitting: Family Medicine

## 2020-04-21 ENCOUNTER — Other Ambulatory Visit: Payer: Self-pay

## 2020-04-21 VITALS — BP 124/80 | HR 76 | Temp 97.7°F | Resp 12 | Ht 60.0 in | Wt 148.0 lb

## 2020-04-21 DIAGNOSIS — R519 Headache, unspecified: Secondary | ICD-10-CM | POA: Diagnosis not present

## 2020-04-21 DIAGNOSIS — E049 Nontoxic goiter, unspecified: Secondary | ICD-10-CM

## 2020-04-21 DIAGNOSIS — Z0000 Encounter for general adult medical examination without abnormal findings: Secondary | ICD-10-CM | POA: Diagnosis not present

## 2020-04-21 DIAGNOSIS — Z130 Encounter for screening for diseases of the blood and blood-forming organs and certain disorders involving the immune mechanism: Secondary | ICD-10-CM | POA: Diagnosis not present

## 2020-04-21 DIAGNOSIS — Z13220 Encounter for screening for lipoid disorders: Secondary | ICD-10-CM | POA: Diagnosis not present

## 2020-04-21 DIAGNOSIS — M542 Cervicalgia: Secondary | ICD-10-CM

## 2020-04-21 DIAGNOSIS — Z13228 Encounter for screening for other metabolic disorders: Secondary | ICD-10-CM

## 2020-04-21 DIAGNOSIS — Z1231 Encounter for screening mammogram for malignant neoplasm of breast: Secondary | ICD-10-CM | POA: Diagnosis not present

## 2020-04-21 DIAGNOSIS — Z1329 Encounter for screening for other suspected endocrine disorder: Secondary | ICD-10-CM | POA: Diagnosis not present

## 2020-04-21 DIAGNOSIS — Z1211 Encounter for screening for malignant neoplasm of colon: Secondary | ICD-10-CM

## 2020-04-21 DIAGNOSIS — Z23 Encounter for immunization: Secondary | ICD-10-CM

## 2020-04-21 DIAGNOSIS — I10 Essential (primary) hypertension: Secondary | ICD-10-CM | POA: Diagnosis not present

## 2020-04-21 LAB — COMPREHENSIVE METABOLIC PANEL
ALT: 22 U/L (ref 0–35)
AST: 22 U/L (ref 0–37)
Albumin: 4.3 g/dL (ref 3.5–5.2)
Alkaline Phosphatase: 107 U/L (ref 39–117)
BUN: 11 mg/dL (ref 6–23)
CO2: 26 mEq/L (ref 19–32)
Calcium: 9.6 mg/dL (ref 8.4–10.5)
Chloride: 106 mEq/L (ref 96–112)
Creatinine, Ser: 0.56 mg/dL (ref 0.40–1.20)
GFR: 110.53 mL/min (ref >60.00)
Glucose, Bld: 95 mg/dL (ref 70–99)
Potassium: 3.9 mEq/L (ref 3.5–5.1)
Sodium: 140 mEq/L (ref 135–145)
Total Bilirubin: 0.7 mg/dL (ref 0.2–1.2)
Total Protein: 7.7 g/dL (ref 6.0–8.3)

## 2020-04-21 LAB — LIPID PANEL
Cholesterol: 182 mg/dL (ref 0–200)
HDL: 48.3 mg/dL (ref >39.00)
LDL Cholesterol: 115 mg/dL — ABNORMAL HIGH (ref 0–99)
NonHDL: 133.8
Total CHOL/HDL Ratio: 4
Triglycerides: 94 mg/dL (ref 0.0–149.0)
VLDL: 18.8 mg/dL (ref 0.0–40.0)

## 2020-04-21 LAB — HEMOGLOBIN A1C: Hgb A1c MFr Bld: 5.3 % (ref 4.6–6.5)

## 2020-04-21 LAB — TSH: TSH: 1.16 u[IU]/mL (ref 0.35–4.50)

## 2020-04-21 MED ORDER — AMLODIPINE BESYLATE 2.5 MG PO TABS: 2.5000 mg | ORAL_TABLET | Freq: Every day | ORAL | 2 refills | Status: DC

## 2020-04-21 NOTE — Patient Instructions (Addendum)
A few things to remember from today's visit:   Routine general medical examination at a health care facility  Encounter for screening mammogram for malignant neoplasm of breast - Plan: Mammogram Digital Screening  Enlarged thyroid gland - Plan: TSH, US THYROID  Colon cancer screening - Plan: Ambulatory referral to Gastroenterology  Screening for lipoid disorders - Plan: Lipid panel, TSH  Screening for endocrine, metabolic and immunity disorder - Plan: Comprehensive metabolic panel, Hemoglobin A1c  If you need refills please call your pharmacy. Do not use My Chart to request refills or for acute issues that need immediate attention.    Please be sure medication list is accurate. If a new problem present, please set up appointment sooner than planned today.        Ch?m [place] phng ng?a 59 tu?i, N? gi?i Preventive Care 59 Years Old, Female Ch?m [place] phng ng?a ?? c?p ??n cc l?n khm v?i chuyn gia ch?m [place] s?c kh?e v cc l?a ch?n l?i s?ng c th? t?ng c??ng s?c kh?e v h?nh phc. Nh?ng vi?c ny bao g?m:  Khm th?c th? hng n?m. Vi?c ny c?ng c th? ???c g?i l ki?m tra s?c kh?e hng n?m.  Khm nha khoa v khm m?t th??ng xuyn.  Ch?ng ng?a.  Sng l?c m?t s? tnh tr?ng nh?t ??nh.  Cc l?a ch?n l?i s?ng lnh m?nh, ch?ng h?n nh? ?n ch? ?? ?n t?t cho s?c kh?e, t?p th? d?c ??u ??n, khng s? d?ng ma ty v cc s?n ph?m c nicotine v thu?c l v h?n ch? u?ng r??u bia. Ti c th? d? ki?n nh?ng g ??i v?i l?n khm ch?m [place] phng ng?a c?a ti? Khm th?c th? Chuyn gia ch?m [place] s?c kh?e s? ki?m tra qu v? v?:  Chi?u cao v cn n?ng. Nh?ng ch? s? ny c th? ???c s? d?ng ?? tnh ton ch? s? kh?i c? th? (BMI), cho bi?t qu v? c cn n?ng kh?e m?nh hay khng.  Nh?p tim v huy?t p.  Da xem c cc ??m b?t th??ng hay khng. T? v?n Chuyn gia ch?m [place] s?c kh?e c th? h?i qu v? v? vi?c:  S? d?ng r??u, thu?c l v ma ty.  Tnh tr?ng s?c kh?e tinh th?n.  Tnh tr?ng h?nh phc  ? nh v trong m?i quan h?.  Quan h? tnh d?c.  Thi quen ?n u?ng.  Cng vi?c v mi tr??ng lm vi?c.  Ph??ng php ng?a [place].  Chu k? kinh nguy?t.  Ti?n s? mang thai. Ti c?n ch?ng ng?a nh?ng g?  V?c xin cm.  V?c xin ny ???c khuy?n ngh? hng n?m. V??c xin u?n vn, b?ch h?u v ho g (Tdap)  Qu v? c th? c?n m?t li?u Td (b?ch h?u, u?n vn) nh?c l?i 10 n?m m?t l?n. V?c xin varicella (th?y ??u)  Qu v? c th? c?n v?c xin ny n?u ch?a ???c chch ng?a. V?c xin Zona  Qu v? c th? c?n v?c xin ny sau tu?i 59. V?c xin s?i, quai b? v rubella (MMR)  Qu v? c th? c?n t nh?t m?t li?u MMR n?u sinh ra vo n?m 1957 ho?c sau ?Sander Nephew v? c?ng c?n li?u th? hai. V?c xin lin h?p ph? c?u khu?n (PCV13)  Qu v? c th? c?n v?c xin ny n?u c m?t s? tnh tr?ng b?nh l nh?t ??nh v tr??c ?y ch?a ???c chch ng?a. V?c xin ph? c?u khu?n polysaccharide (PPSV23)  Qu v? c th? c?n m?t ho?c hai li?u n?u qu v?  ht thu?c ho?c n?u qu v? c m?t s? tnh tr?ng b?nh l nh?t ??nh. V?c xin lin h?p vim mng no (MenACWY)  Qu v? c th? c?n v?c xin ny n?u c cc tnh tr?ng nh?t ??nh. V?c xin vim gan A  Qu v? c th? c?n v?c xin ny n?u c m?t s? tnh tr?ng nh?t ??nh ho?c n?u qu v? ?i du l?ch ho?c lm vi?c ? nh?ng n?i m qu v? c th? b? ph?i nhi?m vim gan A. V?c xin vim gan B  Qu v? c th? c?n v?c xin ny n?u c m?t s? tnh tr?ng b?nh l nh?t ??nh ho?c n?u qu v? ?i du l?ch ho?c lm vi?c ? nh?ng n?i m qu v? c th? b? ph?i nhi?m vim gan B. V?c xin Haemophilus influenzae tup b (Hib)  Qu v? c th? c?n v?c xin ny n?u c cc tnh tr?ng nh?t ??nh. V?c xin Human papillomavirus (HPV)  N?u ???c chuyn gia ch?m [place] s?c kh?e khuy?n ngh?, qu v? c th? c?n ba li?u trong 6 thng. Qu v? c th? ???c tim v?cxin d??i d?ng cc li?u ring l? ho?c nhi?u h?n m?t v?cxin ???c tim cng nhau trong m?t l?n tim (v?cxin k?t h?p). Hy trao ??i v?i chuyn gia ch?m [place] s?c kh?e c?a qu v? v? cc nguy c? v  l?i ch c?a v?cxin k?t h?p. Ti c?n lm cc xt nghi?m no? Xt nghi?m mu  N?ng ?? lipid v cholesterol. L??ng cc ch?t ny c th? ???c ki?m tra 5 n?m m?t l?n ho?c th??ng xuyn h?n n?u qu v? trn 50 tu?i.  Xt nghi?m vim gan C.  Xt nghi?m vim gan B. Sng l?c  Sng l?c ung th? ph?i. Qu v? c th? ???c lm sng l?c ny m?i n?m m?t l?n b?t ??u ? tu?i 54 n?u qu v? c ti?n s? ht 30 bao thu?c m?i n?m v hi?n nay c ht thu?c ho?c ? b? thu?c trong vng 15 n?m tr??c.  Sng l?c ung th? ??i tr?c trng. T?t c? ng??i l?n nn sng l?c b?t ??u ? tu?i 50 v ti?p t?c cho ??n tu?i 75. Chuyn gia ch?m [place] s?c kh?e c?a qu v? c th? khuy?n ngh? sng l?c ? tu?i 45 n?u qu v? b? t?ng nguy c?. Qu v? s? ???c lm cc ki?m tra 1-10 n?m m?t l?n, ty thu?c vo k?t qu? v lo?i ki?m tra sng l?c c?a qu v?.  Sng l?c ti?u ???ng. Xt nghi?m ny ???c th?c hi?n b?ng cch ki?m tra ???ng huy?t (glucose) sau khi qu v? khng ?n g trong m?t kho?ng th?i gian (nh?n ?i). Qu v? c th? ???c lm xt nghi?m ny 1-3 n?m m?t l?n.  Ch?p x-quang tuy?n v. Ki?m tra ny c th? ???c th?c hi?n 1-2 n?m m?t l?n. Hy trao ??i v?i chuyn gia ch?m [place] s?c kh?e v? vi?c khi no qu v? nn b?t ??u ch?p x-quang tuy?n v hng n?m. ?i?u ny c th? ph? thu?c vo vi?c qu v? c ti?n s? gia ?nh b? ung th? v hay khng.  Sng l?c ung th? lin quan ??n BRCA. Xt nghi?m ny c th? ???c th?c hi?n n?u qu v? c ti?n s? gia ?nh b? ung th? v, bu?ng tr?ng, ?ng d?n tr?ng ho?c ung th? phc m?c.  Khm vng ch?u v xt nghi?m ph?t t? bo c? t? cung (Pap). Nh?ng xt nghi?m ny c th? ???c th?c hi?n 3 n?m m?t l?n b?t ??u t? tu?i 21. B?t ??u ?  tu?i 30, xt nghi?m ny c th? ???c th?c hi?n 5 n?m m?t l?n n?u qu v? ???c lm xt nghi?m Pap k?t h?p v?i xt nghi?m HPV. Cc xt nghi?m khc  Ki?m tra b?nh ly truy?n qua ???ng tnh d?c (STD).  Ch?p m?t ?? x??ng. Ki?m tra ny ???c th?c hi?n ?? sng l?c b?nh long x??ng. Qu v? c th? ???c ch?p m?t ?? x??ng n?u  c nguy c? cao b? long x??ng. Tun th? nh?ng h??ng d?n ny ? nh: ?n v u?ng  ?n ch? ?? ?n bao g?m tri cy v rau c? t??i, ng? c?c nguyn h?t, protein t? th?t n?c v s?a t bo.  Dng th?c ph?m b? sung vitamin v ch?t khong theo khuy?n ngh? c?a chuyn gia ch?m [place] s?c kh?e.  Khng u?ng r??u n?u: ? Chuyn gia ch?m [place] s?c kh?e c?a qu v? khuyn qu v? khng u?ng r??u. ? Qu v? c [place], c th? c [place], ho?c c k? ho?ch c [place].  N?u qu v? u?ng r??u: ? Gi?i h?n l??ng r??u qu v? u?ng ? 0-1 ly m?i ngy. ? Bi?t r m?t ly c bao nhiu r??u. ? M?, m?t ly t??ng ???ng v?i m?t chai bia 12 ao x? (355 mL), m?t ly r??u vang 5 ao x? (148 mL), ho?c m?t ly r??u m?nh 1 ao x? (44 mL). L?i s?ng  Ch?m [place] r?ng v l?i hng ngy.  N?ng v?n ??ng. T?p th? d?c t nh?t 30 pht t? 5 ngy tr? ln m?i tu?n.  Khng s? d?ng b?t k? s?n ph?m no c nicotine ho?c thu?c l, ch?ng ha?n nh? thu?c l d?ng ht, thu?c l ?i?n t? v thu?c l d?ng nhai. N?u qu v? c?n gip ?? ?? cai thu?c, hy h?i chuyn gia ch?m [place] s?c kh?e.  N?u qu v? c quan h? tnh d?c, hy th?c hnh quan h? tnh d?c an ton. Dng bao cao su ho?c hnh th?c ki?m sot sinh s?n khc (trnh [place]) ?? ng?n ng?a mang thai v STI (b?nh ly truy?n qua ???ng tnh d?c).  N?u chuyn gia ch?m [place] s?c kh?e ch? d?n, hy dng aspirin li?u th?p hng ngy b?t ??u t? 49 tu?i. C?n lm g ti?p theo?  ??n khm v?i chuyn gia ch?m [place] s?c kh?e m?t l?n m?i n?m ?? ki?m tra s?c kh?e.  Hy h?i chuyn gia ch?m [place] s?c kh?e v? vi?c bao lu th qu v? nn khm m?t v r?ng m?t l?n.  Tim v?c xin ??y ??Tera Mater tin ny khng nh?m m?c ?ch thay th? cho l?i khuyn m chuyn gia ch?m [place] s?c kh?e ni v?i qu v?. Hy b?o ??m qu v? ph?i th?o lu?n b?t k? v?n ?? g m qu v? c v?i chuyn gia ch?m [place] s?c kh?e c?a qu v?. Document Revised: 07/20/2018 Document Reviewed: 07/20/2018 Elsevier Patient Education  2020 [employer].

## 2020-04-25 NOTE — Progress Notes (Signed)
HPI: Denise Blackburn is a 59 y.o. female, who is here today for her routine physical. She doe snot speak [place]. Speaks vietnamese so translator was present during the visit.  Last CPE: She is not sure, it has been a few years.  Regular exercise 3 or more time per week: She walks daily for 30-60 min. Following a healthy diet: Yes, she cooks. She lives with her husband and children.  Chronic medical problems: HTN, chronic neck pain,headaches, and OA among some.  Pap smear: She is not sure Hx of abnormal pap smears: Negative.  Immunization History  Administered Date(s) Administered  . Tdap 04/21/2020   Mammogram: Not sure. Colonoscopy: Never. DEXA: N/A Hep C screening: Never.  She has some concerns today. She was last seen on 02/22/19.  Neck pain ,body aches,and arthralgias upper extremities.  Headache improved after she started antihypertensive medication. No new associated symptoms. Years of parietal headaches, "ponding" sensation and "heavy" sensation.States that it is worse when her BP is high. Tylenol helps. A couple of years ago I recommended Amitriptyline , she discontinued. She has had brain imaging in 06/2012,03/2013,04/2017.  Brain MRI 04/2017: Mild periventricular white matter hyperintensity, most commonly seen in the setting of chronic microvascular ischemia. No acute abnormality.  HTN: She does not check BP at home. Denies CP,palpitations,or dyspnea.  She is taking Amlodipine 5 mg, 1/2 tab daily. HCTZ in the past, discontinued because hypoK+  Constant sharp neck pain,L>R, no radiated. Negative for UE's numbness or tingling.  Cervical CT in 04/2017:Minimal degenerative change without spinal canal or neural foraminal stenosis.  Review of Systems  Constitutional: Positive for fatigue. Negative for appetite change and fever.  HENT: Negative for hearing loss, mouth sores, sore throat and trouble swallowing.   Eyes: Negative for photophobia, pain and  visual disturbance.  Respiratory: Negative for cough and wheezing.   Cardiovascular: Negative for leg swelling.  Gastrointestinal: Negative for abdominal pain, nausea and vomiting.       No changes in bowel habits.  Endocrine: Negative for cold intolerance, heat intolerance, polydipsia, polyphagia and polyuria.  Genitourinary: Negative for decreased urine volume, dysuria, hematuria, vaginal bleeding and vaginal discharge.  Musculoskeletal: Positive for arthralgias and back pain. Negative for gait problem.  Skin: Negative for color change and rash.  Allergic/Immunologic: Positive for environmental allergies.  Neurological: Negative for syncope, facial asymmetry and weakness.  Hematological: Negative for adenopathy. Does not bruise/bleed easily.  Psychiatric/Behavioral: Negative for confusion and sleep disturbance.  All other systems reviewed and are negative.   Current Outpatient Medications on File Prior to Visit  Medication Sig Dispense Refill  . amitriptyline (ELAVIL) 25 MG tablet TAKE 1 TABLET BY MOUTH EVERYDAY AT BEDTIME 90 tablet 1  . omeprazole (PRILOSEC) 20 MG capsule Take 20 mg daily as needed by mouth (Heart burn).     No current facility-administered medications on file prior to visit.    Past Medical History:  Diagnosis Date  . Asthma    7 yrs ago.  She does not use any inhalers  . GERD (gastroesophageal reflux disease)    EGD performed 15-17 yrs ago in [place]  . Headache    migraines  . Hypertension     Past Surgical History:  Procedure Laterality Date  . CHOLECYSTECTOMY N/A 10/26/2017   Procedure: LAPAROSCOPIC CHOLECYSTECTOMY;  Surgeon: Georganna Skeans, MD;  Location: [facility];  Service: General;  Laterality: N/A;    No Known Allergies  History reviewed. No pertinent family history.  Social History  Socioeconomic History  . Marital status: Married    Spouse name: Not on file  . Number of children: Not on file  . Years of education: Not on file  . Highest  education level: Not on file  Occupational History  . Not on file  Tobacco Use  . Smoking status: Never Smoker  . Smokeless tobacco: Never Used  Vaping Use  . Vaping Use: Never used  Substance and Sexual Activity  . Alcohol use: No  . Drug use: No  . Sexual activity: Not on file  Other Topics Concern  . Not on file  Social History Narrative   Housewife   Married for 22 years   3 daughters   Social Determinants of Health   Financial Resource Strain:   . Difficulty of Paying Living Expenses:   Food Insecurity:   . Worried About Charity fundraiser in the Last Year:   . Arboriculturist in the Last Year:   Transportation Needs:   . Film/video editor (Medical):   Marland Kitchen Lack of Transportation (Non-Medical):   Physical Activity:   . Days of Exercise per Week:   . Minutes of Exercise per Session:   Stress:   . Feeling of Stress :   Social Connections:   . Frequency of Communication with Friends and Family:   . Frequency of Social Gatherings with Friends and Family:   . Attends Religious Services:   . Active Member of Clubs or Organizations:   . Attends Archivist Meetings:   Marland Kitchen Marital Status:    Vitals:   04/21/20 0929  BP: 124/80  Pulse: 76  Resp: 12  Temp: 97.7 F (36.5 C)  SpO2: 98%   Body mass index is 28.9 kg/m.  Wt Readings from Last 3 Encounters:  04/21/20 148 lb (67.1 kg)  08/03/18 137 lb 6 oz (62.3 kg)  04/24/18 124 lb 2 oz (56.3 kg)   Physical Exam Vitals and nursing note reviewed.  Constitutional:      General: She is not in acute distress.    Appearance: She is well-developed.  HENT:     Head: Normocephalic and atraumatic.     Right Ear: Hearing, tympanic membrane, ear canal and external ear normal.     Left Ear: Hearing, tympanic membrane, ear canal and external ear normal.     Mouth/Throat:     Mouth: Mucous membranes are moist.     Pharynx: Oropharynx is clear. Uvula midline.  Eyes:     Extraocular Movements: Extraocular  movements intact.     Conjunctiva/sclera: Conjunctivae normal.     Pupils: Pupils are equal, round, and reactive to light.  Neck:     Thyroid: Thyromegaly present. No thyroid tenderness.     Trachea: No tracheal deviation.  Cardiovascular:     Rate and Rhythm: Normal rate and regular rhythm.     Pulses:          Dorsalis pedis pulses are 2+ on the right side and 2+ on the left side.     Heart sounds: No murmur heard.   Pulmonary:     Effort: Pulmonary effort is normal. No respiratory distress.     Breath sounds: Normal breath sounds.  Abdominal:     Palpations: Abdomen is soft. There is no hepatomegaly or mass.     Tenderness: There is no abdominal tenderness.  Genitourinary:    Comments: Deferred to gyn/next visit. Musculoskeletal:     Cervical back: No tenderness or bony  tenderness.     Comments: No signs of synovitis appreciated.  Lymphadenopathy:     Cervical: No cervical adenopathy.     Upper Body:     Right upper body: No supraclavicular adenopathy.     Left upper body: No supraclavicular adenopathy.  Skin:    General: Skin is warm.     Findings: No erythema or rash.  Neurological:     General: No focal deficit present.     Mental Status: She is alert and oriented to person, place, and time.     Cranial Nerves: Cranial nerves are intact.     Coordination: Coordination normal.     Gait: Gait normal.     Deep Tendon Reflexes:     Reflex Scores:      Bicep reflexes are 2+ on the right side and 2+ on the left side.      Patellar reflexes are 2+ on the right side and 2+ on the left side. Psychiatric:        Mood and Affect: Mood and affect normal.        Speech: Speech normal.     Comments: Well groomed, good eye contact.    ASSESSMENT AND PLAN:  Ms. SHATERA RENNERT was here today annual physical examination and follow up.  Orders Placed This Encounter  Procedures  . Mammogram Digital Screening  . US THYROID  . Tdap vaccine greater than or equal to 7yo IM  .  Comprehensive metabolic panel  . Hemoglobin A1c  . Lipid panel  . TSH  . Ambulatory referral to Gastroenterology   Lab Results  Component Value Date   HGBA1C 5.3 04/21/2020   Lab Results  Component Value Date   CREATININE 0.56 04/21/2020   BUN 11 04/21/2020   NA 140 04/21/2020   K 3.9 04/21/2020   CL 106 04/21/2020   CO2 26 04/21/2020   Lab Results  Component Value Date   ALT 22 04/21/2020   AST 22 04/21/2020   ALKPHOS 107 04/21/2020   BILITOT 0.7 04/21/2020   Lab Results  Component Value Date   TSH 1.16 04/21/2020   Lab Results  Component Value Date   CHOL 182 04/21/2020   HDL 48.30 04/21/2020   LDLCALC 115 (H) 04/21/2020   LDLDIRECT 146.7 06/21/2012   TRIG 94.0 04/21/2020   CHOLHDL 4 04/21/2020    Routine general medical examination at a health care facility We discussed the importance of regular physical activity and healthy diet for prevention of chronic illness and/or complications. Preventive guidelines reviewed. She needs to have her gyn exam, she does not want pap today. Recommend arranging appt here or with gyn. Vaccination updated.  Ca++ and vit D supplementation recommended. Next CPE in a year.  Encounter for screening mammogram for malignant neoplasm of breast -     Mammogram Digital Screening; Future  Enlarged thyroid gland Further recommendations according to thyroid US and TSH results.  Colon cancer screening -     Ambulatory referral to Gastroenterology  Screening for lipoid disorders -     Lipid panel -     TSH  Screening for endocrine, metabolic and immunity disorder -     Comprehensive metabolic panel -     Hemoglobin A1c  Essential hypertension BP adequately controlled. Continue Amlodipine 2.5 mg daily. Important to monitor BP at home. Low salt diet also recommended and annual eye exam. -     amLODipine (NORVASC) 2.5 MG tablet; Take 1 tablet (2.5 mg total) by mouth  daily.  Headache, unspecified headache type Improved. ?  Tension like headache.  Cervicalgia Chronic. Continue Tylenol 500 mg 3-4 tabs daily. Instructed about warning signs.  Need for Tdap vaccination -     Tdap vaccine greater than or equal to 7yo IM   Return in 3 months (on 07/22/2020) for HTN and gyn exam.   Zury Fazzino G. [place], MD  [facility]. [place] office.

## 2020-05-01 ENCOUNTER — Encounter: Payer: Self-pay | Admitting: Family Medicine

## 2020-05-01 ENCOUNTER — Ambulatory Visit: Payer: 59 | Admitting: Family Medicine

## 2020-05-01 ENCOUNTER — Other Ambulatory Visit: Payer: Self-pay

## 2020-05-01 VITALS — BP 126/74 | HR 63 | Temp 98.2°F | Wt 149.0 lb

## 2020-05-01 DIAGNOSIS — M549 Dorsalgia, unspecified: Secondary | ICD-10-CM | POA: Diagnosis not present

## 2020-05-01 MED ORDER — METHOCARBAMOL 500 MG PO TABS: 500.0000 mg | ORAL_TABLET | Freq: Three times a day (TID) | ORAL | 0 refills | Status: DC | PRN

## 2020-05-01 MED ORDER — NAPROXEN 500 MG PO TABS: 500.0000 mg | ORAL_TABLET | Freq: Two times a day (BID) | ORAL | 0 refills | Status: DC

## 2020-05-01 NOTE — Patient Instructions (Signed)
Continue with massage of muscle and consider over the counter topical such as Biofreeze or [employer].

## 2020-05-01 NOTE — Progress Notes (Signed)
Established Patient Office Visit  Subjective:  Patient ID: Denise Blackburn, female    DOB: 29-Oct-1960  Age: 59 y.o. MRN: [id number]  CC:  Chief Complaint  Patient presents with  . Shoulder Pain    left shoulder pain radiates down arm and makes affects range of motion for 3 days now     HPI Denise Blackburn presents for pain left trapezius area radiating down toward the shoulder.  Onset 3 days ago.  She is seen with her daughter who is interpreter.  She denies any injury.  Her pain is worse with movement.  Improved with muscle massage.  She did get a recent new mattress but no change of pillow.  Denies any upper extremity numbness or weakness.  No chest pains.  No history of cervical disc problems.  She has not tried any medications.  Past Medical History:  Diagnosis Date  . Asthma    7 yrs ago.  She does not use any inhalers  . GERD (gastroesophageal reflux disease)    EGD performed 15-17 yrs ago in [place]  . Headache    migraines  . Hypertension     Past Surgical History:  Procedure Laterality Date  . CHOLECYSTECTOMY N/A 10/26/2017   Procedure: LAPAROSCOPIC CHOLECYSTECTOMY;  Surgeon: Georganna Skeans, MD;  Location: [facility];  Service: General;  Laterality: N/A;    No family history on file.  Social History   Socioeconomic History  . Marital status: Married    Spouse name: Not on file  . Number of children: Not on file  . Years of education: Not on file  . Highest education level: Not on file  Occupational History  . Not on file  Tobacco Use  . Smoking status: Never Smoker  . Smokeless tobacco: Never Used  Vaping Use  . Vaping Use: Never used  Substance and Sexual Activity  . Alcohol use: No  . Drug use: No  . Sexual activity: Not on file  Other Topics Concern  . Not on file  Social History Narrative   Housewife   Married for 22 years   3 daughters   Social Determinants of Health   Financial Resource Strain:   . Difficulty of Paying Living Expenses:   Food  Insecurity:   . Worried About Charity fundraiser in the Last Year:   . Arboriculturist in the Last Year:   Transportation Needs:   . Film/video editor (Medical):   Marland Kitchen Lack of Transportation (Non-Medical):   Physical Activity:   . Days of Exercise per Week:   . Minutes of Exercise per Session:   Stress:   . Feeling of Stress :   Social Connections:   . Frequency of Communication with Friends and Family:   . Frequency of Social Gatherings with Friends and Family:   . Attends Religious Services:   . Active Member of Clubs or Organizations:   . Attends Archivist Meetings:   Marland Kitchen Marital Status:   Intimate Partner Violence:   . Fear of Current or Ex-Partner:   . Emotionally Abused:   Marland Kitchen Physically Abused:   . Sexually Abused:     Outpatient Medications Prior to Visit  Medication Sig Dispense Refill  . amitriptyline (ELAVIL) 25 MG tablet TAKE 1 TABLET BY MOUTH EVERYDAY AT BEDTIME 90 tablet 1  . amLODipine (NORVASC) 2.5 MG tablet Take 1 tablet (2.5 mg total) by mouth daily. 90 tablet 2  . omeprazole (PRILOSEC) 20 MG capsule Take  20 mg daily as needed by mouth (Heart burn).     No facility-administered medications prior to visit.    No Known Allergies  ROS Review of Systems  Constitutional: Negative for chills and fever.  Respiratory: Negative for cough and shortness of breath.   Cardiovascular: Negative for chest pain.      Objective:    Physical Exam Vitals reviewed.  Constitutional:      Appearance: Normal appearance.  Cardiovascular:     Rate and Rhythm: Normal rate and regular rhythm.  Pulmonary:     Effort: Pulmonary effort is normal.     Breath sounds: Normal breath sounds.  Musculoskeletal:     Cervical back: Neck supple.     Comments: She has full range of motion left shoulder.  She has tenderness left trapezius muscle.  This is around the mid aspect of the left trapezius.  Right trapezius is nontender  Neurological:     Mental Status: She is  alert.     Comments: Full strength upper extremities with symmetric reflexes     BP 126/74 (BP Location: Left Arm, Patient Position: Sitting, Cuff Size: Normal)   Pulse 63   Temp 98.2 F (36.8 C) (Temporal)   Wt 149 lb (67.6 kg)   LMP 04/07/2018   SpO2 99%   BMI 29.10 kg/m  Wt Readings from Last 3 Encounters:  05/01/20 149 lb (67.6 kg)  04/21/20 148 lb (67.1 kg)  08/03/18 137 lb 6 oz (62.3 kg)     Health Maintenance Due  Topic Date Due  . PAP SMEAR-Modifier  Never done  . MAMMOGRAM  Never done  . COLONOSCOPY  Never done    There are no preventive care reminders to display for this patient.  Lab Results  Component Value Date   TSH 1.16 04/21/2020   Lab Results  Component Value Date   WBC 7.0 04/24/2018   HGB 14.3 04/24/2018   HCT 42.2 04/24/2018   MCV 78.2 04/24/2018   PLT 269.0 04/24/2018   Lab Results  Component Value Date   NA 140 04/21/2020   K 3.9 04/21/2020   CO2 26 04/21/2020   GLUCOSE 95 04/21/2020   BUN 11 04/21/2020   CREATININE 0.56 04/21/2020   BILITOT 0.7 04/21/2020   ALKPHOS 107 04/21/2020   AST 22 04/21/2020   ALT 22 04/21/2020   PROT 7.7 04/21/2020   ALBUMIN 4.3 04/21/2020   CALCIUM 9.6 04/21/2020   ANIONGAP 10 09/01/2017   GFR 110.53 04/21/2020   Lab Results  Component Value Date   CHOL 182 04/21/2020   Lab Results  Component Value Date   HDL 48.30 04/21/2020   Lab Results  Component Value Date   LDLCALC 115 (H) 04/21/2020   Lab Results  Component Value Date   TRIG 94.0 04/21/2020   Lab Results  Component Value Date   CHOLHDL 4 04/21/2020   Lab Results  Component Value Date   HGBA1C 5.3 04/21/2020      Assessment & Plan:   Acute onset 3 days ago of left trapezius pain and left upper back pain.  Doubt cervical radiculitis.  Doubt impingement syndrome.  -Recommend trial of naproxen 500 mg twice daily with food -Continue topical sports creams such as Biofreeze or icy hot with muscle massage -Methocarbamol 500 mg  every 6 hours as needed for muscle spasm and she is aware this may cause some sedation.  Follow-up with primary in a couple weeks if not improved  Meds ordered this encounter  Medications  . naproxen (NAPROSYN) 500 MG tablet    Sig: Take 1 tablet (500 mg total) by mouth 2 (two) times daily with a meal.    Dispense:  30 tablet    Refill:  0  . methocarbamol (ROBAXIN) 500 MG tablet    Sig: Take 1 tablet (500 mg total) by mouth every 8 (eight) hours as needed for muscle spasms.    Dispense:  30 tablet    Refill:  0    Follow-up: No follow-ups on file.    Carolann Littler, MD

## 2020-06-04 ENCOUNTER — Ambulatory Visit: Payer: 59

## 2020-06-16 ENCOUNTER — Ambulatory Visit
Admission: RE | Admit: 2020-06-16 | Discharge: 2020-06-16 | Disposition: A | Discharge status: Home / Self Care | Payer: 59 | Source: Ambulatory Visit | Attending: Family Medicine | Admitting: Family Medicine

## 2020-06-16 ENCOUNTER — Other Ambulatory Visit: Payer: Self-pay

## 2020-06-16 DIAGNOSIS — Z1231 Encounter for screening mammogram for malignant neoplasm of breast: Secondary | ICD-10-CM

## 2020-08-07 ENCOUNTER — Telehealth (HOSPITAL_COMMUNITY): Payer: Self-pay | Admitting: Emergency Medicine

## 2020-08-07 ENCOUNTER — Ambulatory Visit (HOSPITAL_COMMUNITY)
Admission: EM | Admit: 2020-08-07 | Discharge: 2020-08-07 | Disposition: A | Discharge status: Home / Self Care | Payer: 59 | Attending: Family Medicine | Admitting: Family Medicine

## 2020-08-07 ENCOUNTER — Other Ambulatory Visit: Payer: Self-pay

## 2020-08-07 ENCOUNTER — Encounter (HOSPITAL_COMMUNITY): Payer: Self-pay

## 2020-08-07 DIAGNOSIS — K5900 Constipation, unspecified: Secondary | ICD-10-CM

## 2020-08-07 DIAGNOSIS — G8929 Other chronic pain: Secondary | ICD-10-CM

## 2020-08-07 DIAGNOSIS — R1013 Epigastric pain: Secondary | ICD-10-CM

## 2020-08-07 MED ORDER — ALUM & MAG HYDROXIDE-SIMETH 200-200-20 MG/5ML PO SUSP
ORAL | Status: AC
  Filled 2020-08-07: qty 30

## 2020-08-07 MED ORDER — POLYETHYLENE GLYCOL 3350 17 G PO PACK: 17.0000 g | PACK | Freq: Every day | ORAL | 0 refills | Status: DC

## 2020-08-07 MED ORDER — LIDOCAINE VISCOUS HCL 2 % MT SOLN
OROMUCOSAL | Status: AC
  Filled 2020-08-07: qty 15

## 2020-08-07 MED ORDER — LIDOCAINE VISCOUS HCL 2 % MT SOLN: 15.0000 mL | Freq: Once | OROMUCOSAL | Status: DC

## 2020-08-07 MED ORDER — ALUM & MAG HYDROXIDE-SIMETH 200-200-20 MG/5ML PO SUSP: 30.0000 mL | Freq: Once | ORAL | Status: DC

## 2020-08-07 MED ORDER — ALUMINUM-MAGNESIUM-SIMETHICONE 200-200-20 MG/5ML PO SUSP: 30.0000 mL | Freq: Two times a day (BID) | ORAL | 0 refills | Status: DC | PRN

## 2020-08-07 MED ORDER — PANTOPRAZOLE SODIUM 20 MG PO TBEC: 20.0000 mg | DELAYED_RELEASE_TABLET | Freq: Every day | ORAL | 0 refills | Status: DC

## 2020-08-07 NOTE — ED Triage Notes (Signed)
Pt presents with abdominal pain after eating spicy food x 1 month. States after eating spicy food she feels the urgency to go to the restroom.  Prilosec gives somewhat relief.

## 2020-08-07 NOTE — Telephone Encounter (Signed)
Received call that patient's prescriptions did not go through to pharmacy.  Verified transmission failed for prescription, and called pharmacy voicemail to leave verbals

## 2020-08-07 NOTE — Discharge Instructions (Addendum)
I believe your symptoms are associated with acid reflux.  We are going to try pantoprazole 20 mg once daily  Maalox as needed  Make sure that you take the pantoprazole  30- 60 minutes prior to a meal with a glass of water.  Avoid spicy, greasy foods, caffeine, chocolate and milk products.  No eating 2-3 hours before bedtime. Elevate the head of the bed 30 degrees.  Try this for a few weeks to see if this improves your symptoms.  If you don't see any improvement or your symptoms worsen please follow up with a GI   Miralax for constipation, take this daily until you get a good bowel movement and then as needed for constipation

## 2020-08-07 NOTE — ED Provider Notes (Signed)
Denise Blackburn    CSN: [id number] Arrival date & time: 08/07/20  4098      History   Chief Complaint Chief Complaint  Patient presents with  . Abdominal Pain    HPI Denise Blackburn is a 59 y.o. female.   Pt is a 59 year old female that presents with upper abdominal abdominal pain. This has been intermittent, waxing and waning over the past month. Hx of GERD. Has been taking Prilosec as needed with some relief. Worse after eating spicy food. She has also been constipated. She has been straining to have BM. No N,V,D. No blood in stool. Hx of cholecystectomy.      Past Medical History:  Diagnosis Date  . Asthma    7 yrs ago.  She does not use any inhalers  . GERD (gastroesophageal reflux disease)    EGD performed 15-17 yrs ago in [place]  . Headache    migraines  . Hypertension     Patient Active Problem List   Diagnosis Date Noted  . Cervicalgia 08/03/2018  . Hypokalemia 08/03/2018  . HTN (hypertension) 04/19/2018  . Vertigo 08/02/2012  . Nonspecific elevation of levels of transaminase or lactic acid dehydrogenase (LDH) 07/05/2012  . Headache, unspecified headache type 06/21/2012  . Epigastric pain 06/21/2012    Past Surgical History:  Procedure Laterality Date  . CHOLECYSTECTOMY N/A 10/26/2017   Procedure: LAPAROSCOPIC CHOLECYSTECTOMY;  Surgeon: Georganna Skeans, MD;  Location: [place];  Service: General;  Laterality: N/A;    OB History   No obstetric history on file.      Home Medications    Prior to Admission medications   Medication Sig Start Date End Date Taking? Authorizing Provider  aluminum-magnesium hydroxide-simethicone (MAALOX) 119-147-82 MG/5ML SUSP Take 30 mLs by mouth 2 (two) times daily as needed. 08/07/20   Loura Halt A, NP  amitriptyline (ELAVIL) 25 MG tablet TAKE 1 TABLET BY MOUTH EVERYDAY AT BEDTIME 08/27/18   Martinique, Betty G, MD  amLODipine (NORVASC) 2.5 MG tablet Take 1 tablet (2.5 mg total) by mouth daily. 04/21/20   [place], Betty G,  MD  methocarbamol (ROBAXIN) 500 MG tablet Take 1 tablet (500 mg total) by mouth every 8 (eight) hours as needed for muscle spasms. 05/01/20   Burchette, Alinda Sierras, MD  naproxen (NAPROSYN) 500 MG tablet Take 1 tablet (500 mg total) by mouth 2 (two) times daily with a meal. 05/01/20   Burchette, Alinda Sierras, MD  omeprazole (PRILOSEC) 20 MG capsule Take 20 mg daily as needed by mouth (Heart burn).    [provider]  pantoprazole (PROTONIX) 20 MG tablet Take 1 tablet (20 mg total) by mouth daily. 08/07/20   Deryck Hippler, Tressia Miners A, NP  polyethylene glycol (MIRALAX / GLYCOLAX) 17 g packet Take 17 g by mouth daily. 08/07/20   Orvan July, NP    Family History History reviewed. No pertinent family history.  Social History Social History   Tobacco Use  . Smoking status: Never Smoker  . Smokeless tobacco: Never Used  Vaping Use  . Vaping Use: Never used  Substance Use Topics  . Alcohol use: No  . Drug use: No     Allergies   Patient has no known allergies.   Review of Systems Review of Systems   Physical Exam Triage Vital Signs ED Triage Vitals  Enc Vitals Group     BP 08/07/20 0951 120/77     Pulse Rate 08/07/20 0951 (!) 58     Resp --  Temp --      Temp Source 08/07/20 0951 Oral     SpO2 08/07/20 0951 99 %     Weight --      Height --      Head Circumference --      Peak Flow --      Pain Score 08/07/20 0956 4     Pain Loc --      Pain Edu? --      Excl. in [place]? --    No data found.  Updated Vital Signs BP 120/77 (BP Location: Right Arm)   Pulse (!) 58   LMP 04/07/2018   SpO2 99%   Visual Acuity Right Eye Distance:   Left Eye Distance:   Bilateral Distance:    Right Eye Near:   Left Eye Near:    Bilateral Near:     Physical Exam Vitals and nursing note reviewed.  Constitutional:      General: She is not in acute distress.    Appearance: Normal appearance. She is not ill-appearing, toxic-appearing or diaphoretic.  HENT:     Head: Normocephalic.     Nose:  Nose normal.  Eyes:     Conjunctiva/sclera: Conjunctivae normal.  Cardiovascular:     Rate and Rhythm: Normal rate and regular rhythm.  Pulmonary:     Effort: Pulmonary effort is normal.     Breath sounds: Normal breath sounds.  Abdominal:     General: Abdomen is flat. Bowel sounds are decreased.     Palpations: Abdomen is soft.     Tenderness: There is generalized abdominal tenderness. There is no guarding.  Musculoskeletal:        General: Normal range of motion.     Cervical back: Normal range of motion.  Skin:    General: Skin is warm and dry.     Findings: No rash.  Neurological:     Mental Status: She is alert.  Psychiatric:        Mood and Affect: Mood normal.      UC Treatments / Results  Labs (all labs ordered are listed, but only abnormal results are displayed) Labs Reviewed - No data to display  EKG   Radiology No results found.  Procedures Procedures (including critical care time)  Medications Ordered in UC Medications  alum & mag hydroxide-simeth (MAALOX/MYLANTA) 200-200-20 MG/5ML suspension 30 mL (has no administration in time range)    And  lidocaine (XYLOCAINE) 2 % viscous mouth solution 15 mL (has no administration in time range)    Initial Impression / Assessment and Plan / UC Course  I have reviewed the triage vital signs and the nursing notes.  Pertinent labs & imaging results that were available during my care of the patient were reviewed by me and considered in my medical decision making (see chart for details).     GERD Most likely cause along with constipation Pt has chronic GERD issues Has not see a specialist in many years.  GI cocktail given here with relief.  Prescribing pantoprazole to take daily with diet instructions maalox as needed  miralax for constipation.  GI for follow up.    Final Clinical Impressions(s) / UC Diagnoses   Final diagnoses:  Abdominal pain, chronic, epigastric  Constipation, unspecified  constipation type     Discharge Instructions     I believe your symptoms are associated with acid reflux.  We are going to try pantoprazole 20 mg once daily  Maalox as needed  Make sure that  you take the pantoprazole  30- 60 minutes prior to a meal with a glass of water.  Avoid spicy, greasy foods, caffeine, chocolate and milk products.  No eating 2-3 hours before bedtime. Elevate the head of the bed 30 degrees.  Try this for a few weeks to see if this improves your symptoms.  If you don't see any improvement or your symptoms worsen please follow up with a GI   Miralax for constipation, take this daily until you get a good bowel movement and then as needed for constipation      ED Prescriptions    Medication Sig Dispense Auth. Provider   polyethylene glycol (MIRALAX / GLYCOLAX) 17 g packet Take 17 g by mouth daily. 14 each Eura Mccauslin A, NP   pantoprazole (PROTONIX) 20 MG tablet Take 1 tablet (20 mg total) by mouth daily. 30 tablet Taige Housman A, NP   aluminum-magnesium hydroxide-simethicone (MAALOX) 017-793-90 MG/5ML SUSP Take 30 mLs by mouth 2 (two) times daily as needed. 1,680 mL Loura Halt A, NP     PDMP not reviewed this encounter.   Orvan July, NP 08/07/20 1057

## 2020-12-28 ENCOUNTER — Other Ambulatory Visit: Payer: Self-pay | Admitting: Family Medicine

## 2020-12-28 DIAGNOSIS — I10 Essential (primary) hypertension: Secondary | ICD-10-CM

## 2021-02-15 ENCOUNTER — Encounter: Payer: Self-pay | Admitting: Physician Assistant

## 2021-02-16 ENCOUNTER — Ambulatory Visit (INDEPENDENT_AMBULATORY_CARE_PROVIDER_SITE_OTHER): Payer: 59 | Admitting: Family Medicine

## 2021-02-16 ENCOUNTER — Encounter: Payer: Self-pay | Admitting: Family Medicine

## 2021-02-16 ENCOUNTER — Other Ambulatory Visit: Payer: Self-pay

## 2021-02-16 VITALS — BP 110/70 | HR 71 | Temp 98.8°F | Resp 16 | Ht 60.0 in | Wt 148.8 lb

## 2021-02-16 DIAGNOSIS — R1084 Generalized abdominal pain: Secondary | ICD-10-CM | POA: Diagnosis not present

## 2021-02-16 DIAGNOSIS — Z1211 Encounter for screening for malignant neoplasm of colon: Secondary | ICD-10-CM | POA: Diagnosis not present

## 2021-02-16 DIAGNOSIS — K5900 Constipation, unspecified: Secondary | ICD-10-CM

## 2021-02-16 LAB — URINALYSIS, ROUTINE W REFLEX MICROSCOPIC
Bilirubin Urine: NEGATIVE
Hgb urine dipstick: NEGATIVE
Ketones, ur: NEGATIVE
Leukocytes,Ua: NEGATIVE
Nitrite: NEGATIVE
Specific Gravity, Urine: 1.01 (ref 1.000–1.030)
Total Protein, Urine: NEGATIVE
Urine Glucose: NEGATIVE
Urobilinogen, UA: 0.2 (ref 0.0–1.0)
pH: 6.5 (ref 5.0–8.0)

## 2021-02-16 LAB — BASIC METABOLIC PANEL
BUN: 10 mg/dL (ref 6–23)
CO2: 29 mEq/L (ref 19–32)
Calcium: 9.6 mg/dL (ref 8.4–10.5)
Chloride: 103 mEq/L (ref 96–112)
Creatinine, Ser: 0.67 mg/dL (ref 0.40–1.20)
GFR: 94.92 mL/min (ref >60.00)
Glucose, Bld: 89 mg/dL (ref 70–99)
Potassium: 4.2 mEq/L (ref 3.5–5.1)
Sodium: 141 mEq/L (ref 135–145)

## 2021-02-16 LAB — CBC
HCT: 41.7 % (ref 36.0–46.0)
Hemoglobin: 13.8 g/dL (ref 12.0–15.0)
MCHC: 33.1 g/dL (ref 30.0–36.0)
MCV: 81.9 fl (ref 78.0–100.0)
Platelets: 281 10*3/uL (ref 150.0–400.0)
RBC: 5.09 Mil/uL (ref 3.87–5.11)
RDW: 13.7 % (ref 11.5–15.5)
WBC: 7.5 10*3/uL (ref 4.0–10.5)

## 2021-02-16 LAB — TSH: TSH: 1.03 u[IU]/mL (ref 0.35–4.50)

## 2021-02-16 NOTE — Progress Notes (Signed)
Chief Complaint  Patient presents with  . Abdominal Pain  . Constipation   HPI: [name] is a 60 y.o. female with hx of hypoK+,chronic neck pain,and HTN here today with a translator complaining of intermittent abdominal pain for a while, seems to be worse for the past couple months. Most of the time lower abdominal pain, sometimes also upper, intermittent, a few times per week. Burning like sensation. 9/10 in intensity.  Pain is not radiated. No associated fever,chills,abdomal wt loss,nausea,vomiting,melena,blood in stool,or urinary symptoms. Decreased appetite. Pain is alleviated by defecation and exacerbated by food intake.  +Constipation. Small ,hard,"little balls", and straining.  Increasing fiber intake helps. She has bowel movements 2-3 times per week. Last bowel movement was yesterday. She took Mylanta yesterday.  She was evaluated in the ER for abdominal pain on 08/07/20. Pantoprazole was prescribed, did not help.  Lab Results  Component Value Date   CREATININE 0.56 04/21/2020   BUN 11 04/21/2020   NA 140 04/21/2020   K 3.9 04/21/2020   CL 106 04/21/2020   CO2 26 04/21/2020   Lab Results  Component Value Date   WBC 7.0 04/24/2018   HGB 14.3 04/24/2018   HCT 42.2 04/24/2018   MCV 78.2 04/24/2018   PLT 269.0 04/24/2018   Lab Results  Component Value Date   TSH 1.16 04/21/2020   Review of Systems  Constitutional: Negative for activity change and fatigue.  HENT: Negative for mouth sores, nosebleeds, sore throat and trouble swallowing.   Respiratory: Negative for cough, shortness of breath and wheezing.   Cardiovascular: Negative for chest pain, palpitations and leg swelling.  Endocrine: Negative for cold intolerance and heat intolerance.  Genitourinary: Negative for decreased urine volume, dysuria and hematuria.  Musculoskeletal: Negative for gait problem and myalgias.  Skin: Negative for pallor and rash.  Neurological: Negative for syncope and  weakness.  Rest see pertinent positives and negatives per HPI.  Current Outpatient Medications on File Prior to Visit  Medication Sig Dispense Refill  . aluminum-magnesium hydroxide-simethicone (MAALOX) 161-096-04 MG/5ML SUSP Take 30 mLs by mouth 2 (two) times daily as needed. 1680 mL 0  . amitriptyline (ELAVIL) 25 MG tablet TAKE 1 TABLET BY MOUTH EVERYDAY AT BEDTIME 90 tablet 1  . amLODipine (NORVASC) 2.5 MG tablet TAKE 1 TABLET BY MOUTH EVERY DAY 90 tablet 1  . methocarbamol (ROBAXIN) 500 MG tablet Take 1 tablet (500 mg total) by mouth every 8 (eight) hours as needed for muscle spasms. 30 tablet 0  . naproxen (NAPROSYN) 500 MG tablet Take 1 tablet (500 mg total) by mouth 2 (two) times daily with a meal. 30 tablet 0  . omeprazole (PRILOSEC) 20 MG capsule Take 20 mg daily as needed by mouth (Heart burn).    . pantoprazole (PROTONIX) 20 MG tablet Take 1 tablet (20 mg total) by mouth daily. 30 tablet 0  . polyethylene glycol (MIRALAX / GLYCOLAX) 17 g packet Take 17 g by mouth daily. 14 each 0   No current facility-administered medications on file prior to visit.     Past Medical History:  Diagnosis Date  . Asthma    7 yrs ago.  She does not use any inhalers  . GERD (gastroesophageal reflux disease)    EGD performed 15-17 yrs ago in [place]  . Headache    migraines  . Hypertension    No Known Allergies  Social History   Socioeconomic History  . Marital status: Married    Spouse name: Not on file  .  Number of children: Not on file  . Years of education: Not on file  . Highest education level: Not on file  Occupational History  . Not on file  Tobacco Use  . Smoking status: Never Smoker  . Smokeless tobacco: Never Used  Vaping Use  . Vaping Use: Never used  Substance and Sexual Activity  . Alcohol use: No  . Drug use: No  . Sexual activity: Not on file  Other Topics Concern  . Not on file  Social History Narrative   Housewife   Married for 22 years   3 daughters    Social Determinants of Health   Financial Resource Strain: Not on file  Food Insecurity: Not on file  Transportation Needs: Not on file  Physical Activity: Not on file  Stress: Not on file  Social Connections: Not on file    Vitals:   02/16/21 1105  BP: 110/70  Pulse: 71  Resp: 16  Temp: 98.8 F (37.1 C)  SpO2: 96%   Body mass index is 29.06 kg/m.  Physical Exam Vitals and nursing note reviewed.  Constitutional:      General: She is not in acute distress.    Appearance: She is well-developed and well-groomed.  HENT:     Head: Normocephalic and atraumatic.  Eyes:     Conjunctiva/sclera: Conjunctivae normal.     Pupils: Pupils are equal, round, and reactive to light.  Cardiovascular:     Rate and Rhythm: Normal rate and regular rhythm.     Pulses:          Dorsalis pedis pulses are 2+ on the right side and 2+ on the left side.     Heart sounds: No murmur heard.   Pulmonary:     Effort: Pulmonary effort is normal. No respiratory distress.     Breath sounds: Normal breath sounds.  Abdominal:     Palpations: Abdomen is soft. There is no hepatomegaly or mass.     Tenderness: There is abdominal tenderness in the right lower quadrant, suprapubic area and left lower quadrant.  Lymphadenopathy:     Cervical: No cervical adenopathy.  Skin:    General: Skin is warm.     Findings: No erythema or rash.  Neurological:     Mental Status: She is alert and oriented to person, place, and time.     Cranial Nerves: No cranial nerve deficit.     Gait: Gait normal.     ASSESSMENT AND PLAN:  Denise Blackburn was seen today for abdominal pain and constipation.  Diagnoses and all orders for this visit: Orders Placed This Encounter  Procedures  . TSH  . Basic metabolic panel  . CBC  . Urinalysis, Routine w reflex microscopic  . Ambulatory referral to Gastroenterology   Lab Results  Component Value Date   TSH 1.03 02/16/2021   Lab Results  Component Value Date   CREATININE  0.67 02/16/2021   BUN 10 02/16/2021   NA 141 02/16/2021   K 4.2 02/16/2021   CL 103 02/16/2021   CO2 29 02/16/2021   Lab Results  Component Value Date   WBC 7.5 02/16/2021   HGB 13.8 02/16/2021   HCT 41.7 02/16/2021   MCV 81.9 02/16/2021   PLT 281.0 02/16/2021    Generalized abdominal pain We discussed possible etiologies. Most likely caused by constipation, IBS-C. I do not think imaging is needed today. Instructed about warning signs. Further recommendations according to lab results.  Constipation, unspecified constipation type Chronic. Adequate  fiber and fluid intake explained are the main part of the treatment. Miralax daily at night and Dulcolax 5 mg daily as needed. Benefiber 1 tsp bid may also help. If not better with OTC treatment, Linzess will be considered.  Colon cancer screening -     Ambulatory referral to Gastroenterology   Return in about 3 months (around 05/18/2021) for constipation, HTN.   Jeannette Maddy G. Martinique, MD  [facility]. [place] office.  A few things to remember from today's visit:   Generalized abdominal pain - Plan: TSH, Basic metabolic panel, CBC, Urinalysis, Routine w reflex microscopic  Constipation, unspecified constipation type - Plan: TSH, Basic metabolic panel  Colon cancer screening - Plan: Ambulatory referral to Gastroenterology  If you need refills please call your pharmacy. Do not use My Chart to request refills or for acute issues that need immediate attention.   Miralax daily at bedtime for a few days and then as needed. Dulcolax 5 mg at bedtime as needed. Benefiber 1 tsp 2 times daily. Adequate hydration.  If not better we can consider prescription medications like Linzess.   Constipation, Adult Constipation is when a person has fewer than three bowel movements in a week, has difficulty having a bowel movement, or has stools (feces) that are dry, hard, or larger than normal. Constipation may be caused by an  underlying condition. It may become worse with age if a person takes certain medicines and does not take in enough fluids. Follow these instructions at home: Eating and drinking  Eat foods that have a lot of fiber, such as beans, whole grains, and fresh fruits and vegetables.  Limit foods that are low in fiber and high in fat and processed sugars, such as fried or sweet foods. These include french fries, hamburgers, cookies, candies, and soda.  Drink enough fluid to keep your urine pale yellow.   General instructions  Exercise regularly or as told by your health care provider. Try to do 150 minutes of moderate exercise each week.  Use the bathroom when you have the urge to go. Do not hold it in.  Take over-the-counter and prescription medicines only as told by your health care provider. This includes any fiber supplements.  During bowel movements: ? Practice deep breathing while relaxing the lower abdomen. ? Practice pelvic floor relaxation.  Watch your condition for any changes. Let your health care provider know about them.  Keep all follow-up visits as told by your health care provider. This is important. Contact a health care provider if:  You have pain that gets worse.  You have a fever.  You do not have a bowel movement after 4 days.  You vomit.  You are not hungry or you lose weight.  You are bleeding from the opening between the buttocks (anus).  You have thin, pencil-like stools. Get help right away if:  You have a fever and your symptoms suddenly get worse.  You leak stool or have blood in your stool.  Your abdomen is bloated.  You have severe pain in your abdomen.  You feel dizzy or you faint. Summary  Constipation is when a person has fewer than three bowel movements in a week, has difficulty having a bowel movement, or has stools (feces) that are dry, hard, or larger than normal.  Eat foods that have a lot of fiber, such as beans, whole grains, and  fresh fruits and vegetables.  Drink enough fluid to keep your urine pale yellow.  Take over-the-counter and  prescription medicines only as told by your health care provider. This includes any fiber supplements. This information is not intended to replace advice given to you by your health care provider. Make sure you discuss any questions you have with your health care provider. Document Revised: 08/28/2019 Document Reviewed: 08/28/2019 Elsevier Patient Education  2021 [place].  Please be sure medication list is accurate. If a new problem present, please set up appointment sooner than planned today.

## 2021-02-16 NOTE — Patient Instructions (Addendum)
A few things to remember from today's visit:   Generalized abdominal pain - Plan: TSH, Basic metabolic panel, CBC, Urinalysis, Routine w reflex microscopic  Constipation, unspecified constipation type - Plan: TSH, Basic metabolic panel  Colon cancer screening - Plan: Ambulatory referral to Gastroenterology  If you need refills please call your pharmacy. Do not use My Chart to request refills or for acute issues that need immediate attention.   Miralax daily at bedtime for a few days and then as needed. Dulcolax 5 mg at bedtime as needed. Benefiber 1 tsp 2 times daily. Adequate hydration.  If not better we can consider prescription medications like Linzess.   Constipation, Adult Constipation is when a person has fewer than three bowel movements in a week, has difficulty having a bowel movement, or has stools (feces) that are dry, hard, or larger than normal. Constipation may be caused by an underlying condition. It may become worse with age if a person takes certain medicines and does not take in enough fluids. Follow these instructions at home: Eating and drinking  Eat foods that have a lot of fiber, such as beans, whole grains, and fresh fruits and vegetables.  Limit foods that are low in fiber and high in fat and processed sugars, such as fried or sweet foods. These include french fries, hamburgers, cookies, candies, and soda.  Drink enough fluid to keep your urine pale yellow.   General instructions  Exercise regularly or as told by your health care provider. Try to do 150 minutes of moderate exercise each week.  Use the bathroom when you have the urge to go. Do not hold it in.  Take over-the-counter and prescription medicines only as told by your health care provider. This includes any fiber supplements.  During bowel movements: ? Practice deep breathing while relaxing the lower abdomen. ? Practice pelvic floor relaxation.  Watch your condition for any changes. Let your  health care provider know about them.  Keep all follow-up visits as told by your health care provider. This is important. Contact a health care provider if:  You have pain that gets worse.  You have a fever.  You do not have a bowel movement after 4 days.  You vomit.  You are not hungry or you lose weight.  You are bleeding from the opening between the buttocks (anus).  You have thin, pencil-like stools. Get help right away if:  You have a fever and your symptoms suddenly get worse.  You leak stool or have blood in your stool.  Your abdomen is bloated.  You have severe pain in your abdomen.  You feel dizzy or you faint. Summary  Constipation is when a person has fewer than three bowel movements in a week, has difficulty having a bowel movement, or has stools (feces) that are dry, hard, or larger than normal.  Eat foods that have a lot of fiber, such as beans, whole grains, and fresh fruits and vegetables.  Drink enough fluid to keep your urine pale yellow.  Take over-the-counter and prescription medicines only as told by your health care provider. This includes any fiber supplements. This information is not intended to replace advice given to you by your health care provider. Make sure you discuss any questions you have with your health care provider. Document Revised: 08/28/2019 Document Reviewed: 08/28/2019 Elsevier Patient Education  2021 [place].  Please be sure medication list is accurate. If a new problem present, please set up appointment sooner than planned today.

## 2021-02-18 ENCOUNTER — Encounter: Payer: Self-pay | Admitting: Family Medicine

## 2021-03-02 ENCOUNTER — Other Ambulatory Visit: Payer: Self-pay

## 2021-03-02 ENCOUNTER — Encounter: Payer: Self-pay | Admitting: Physician Assistant

## 2021-03-02 ENCOUNTER — Ambulatory Visit (INDEPENDENT_AMBULATORY_CARE_PROVIDER_SITE_OTHER): Payer: 59 | Admitting: Physician Assistant

## 2021-03-02 VITALS — BP 116/74 | HR 64 | Ht 61.0 in | Wt 149.0 lb

## 2021-03-02 DIAGNOSIS — K5900 Constipation, unspecified: Secondary | ICD-10-CM | POA: Diagnosis not present

## 2021-03-02 DIAGNOSIS — Z1211 Encounter for screening for malignant neoplasm of colon: Secondary | ICD-10-CM

## 2021-03-02 DIAGNOSIS — Z1212 Encounter for screening for malignant neoplasm of rectum: Secondary | ICD-10-CM | POA: Diagnosis not present

## 2021-03-02 MED ORDER — PLENVU 140 G PO SOLR: ORAL | 0 refills | Status: DC

## 2021-03-02 NOTE — Progress Notes (Signed)
Chief Complaint: Constipation and lower abdominal pain  HPI:    Denise Blackburn is a 60 year old [place]-year-old female, who presents to clinic today with interpreter, who was referred to me by [place], Betty G, MD for a complaint of constipation and lower abdominal pain.      02/16/2021 patient seen in the office by her PCP and complained of intermittent abdominal pain for a while, over the past few months.  Most of the times is lower abdominal pain but sometimes also upper a few times a week.  Described as burning, 9/10 in intensity.  Associated symptoms include decreased appetite, pain is better with defecation exacerbated by food intake.  Describes small hard little balls and straining for bowel movements.  She increased her fiber and now has bowel movements 2-3 times a week.  Previously evaluated in the ER 08/07/2020 for abdominal pain and pantoprazole was prescribed but did not help.  It was discussed that most likely constipation was the cause and she was told to take MiraLAX nightly as well as Dulcolax as needed and start a fiber supplement.  Also referred to [place] for a screening colonoscopy.    02/16/2021 labs to include a TSH, BMP and CBC which were normal.    Today, the patient presents with an interpreter and explains that for 7 months now she has been having some lower abdominal pain and constipation.  When she is able to have a good stool the abdominal pain goes away.  Tells me this pain seems to radiate through to her back and into her upper abdomen.  Recently has been using MiraLAX.  She was told to use this nightly by her PCP but has still not been doing this.  In fact, she skipped her dose yesterday and is feeling uncomfortable now.    Denies previous colonoscopy.    Denies fever, chills, weight loss, blood in her stool or symptoms that awaken her from sleep.  Past Medical History:  Diagnosis Date  . Asthma    7 yrs ago.  She does not use any inhalers  . GERD (gastroesophageal reflux  disease)    EGD performed 15-17 yrs ago in [place]  . Headache    migraines  . Hypertension     Past Surgical History:  Procedure Laterality Date  . CHOLECYSTECTOMY N/A 10/26/2017   Procedure: LAPAROSCOPIC CHOLECYSTECTOMY;  Surgeon: Georganna Skeans, MD;  Location: [facility];  Service: General;  Laterality: N/A;    Current Outpatient Medications  Medication Sig Dispense Refill  . aluminum-magnesium hydroxide-simethicone (MAALOX) 381-017-51 MG/5ML SUSP Take 30 mLs by mouth 2 (two) times daily as needed. 1680 mL 0  . amitriptyline (ELAVIL) 25 MG tablet TAKE 1 TABLET BY MOUTH EVERYDAY AT BEDTIME 90 tablet 1  . amLODipine (NORVASC) 2.5 MG tablet TAKE 1 TABLET BY MOUTH EVERY DAY 90 tablet 1  . methocarbamol (ROBAXIN) 500 MG tablet Take 1 tablet (500 mg total) by mouth every 8 (eight) hours as needed for muscle spasms. 30 tablet 0  . naproxen (NAPROSYN) 500 MG tablet Take 1 tablet (500 mg total) by mouth 2 (two) times daily with a meal. 30 tablet 0  . omeprazole (PRILOSEC) 20 MG capsule Take 20 mg daily as needed by mouth (Heart burn).    . pantoprazole (PROTONIX) 20 MG tablet Take 1 tablet (20 mg total) by mouth daily. 30 tablet 0  . polyethylene glycol (MIRALAX / GLYCOLAX) 17 g packet Take 17 g by mouth daily. 14 each 0   No current facility-administered medications  for this visit.    Allergies as of 03/02/2021  . (No Known Allergies)    No family history on file.  Social History   Socioeconomic History  . Marital status: Married    Spouse name: Not on file  . Number of children: Not on file  . Years of education: Not on file  . Highest education level: Not on file  Occupational History  . Not on file  Tobacco Use  . Smoking status: Never Smoker  . Smokeless tobacco: Never Used  Vaping Use  . Vaping Use: Never used  Substance and Sexual Activity  . Alcohol use: No  . Drug use: No  . Sexual activity: Not on file  Other Topics Concern  . Not on file  Social History Narrative    Housewife   Married for 40 years   3 daughters   Social Determinants of Health   Financial Resource Strain: Not on file  Food Insecurity: Not on file  Transportation Needs: Not on file  Physical Activity: Not on file  Stress: Not on file  Social Connections: Not on file  Intimate Partner Violence: Not on file    Review of Systems:    Constitutional: No weight loss, fever or chills Skin: No rash  Cardiovascular: No chest pain Respiratory: No SOB  Gastrointestinal: See HPI and otherwise negative Genitourinary: No dysuria  Neurological: No headache, dizziness or syncope Musculoskeletal: No new muscle or joint pain Hematologic: No bleeding Psychiatric: No history of depression or anxiety   Physical Exam:  Vital signs: BP 116/74   Pulse 64   Ht 5\' 1"  (1.549 m)   Wt 149 lb (67.6 kg)   LMP 04/07/2018   BMI 28.15 kg/m   Constitutional:   [place] female appears to be in NAD, Well developed, Well nourished, alert and cooperative Head:  Normocephalic and atraumatic. Eyes:   PEERL, EOMI. No icterus. Conjunctiva pink. Ears:  Normal auditory acuity. Neck:  Supple Throat: Oral cavity and pharynx without inflammation, swelling or lesion.  Respiratory: Respirations even and unlabored. Lungs clear to auscultation bilaterally.   No wheezes, crackles, or rhonchi.  Cardiovascular: Normal S1, S2. No MRG. Regular rate and rhythm. No peripheral edema, cyanosis or pallor.  Gastrointestinal:  Soft, mild generalized distention, mild bilateral lower TTP. No rebound or guarding.  Decreased bowel sounds all 4 quadrants. No appreciable masses or hepatomegaly. Rectal:  Not performed.  Msk:  Symmetrical without gross deformities. Without edema, no deformity or joint abnormality.  Neurologic:  Alert and  oriented x4;  grossly normal neurologically.  Skin:   Dry and intact without significant lesions or rashes. Psychiatric: Demonstrates good judgement and reason without abnormal affect  or behaviors.  Most recent LABS AND IMAGING: CBC    Component Value Date/Time   WBC 7.5 02/16/2021 1131   RBC 5.09 02/16/2021 1131   HGB 13.8 02/16/2021 1131   HCT 41.7 02/16/2021 1131   PLT 281.0 02/16/2021 1131   MCV 81.9 02/16/2021 1131   MCH 26.5 10/18/2017 0949   MCHC 33.1 02/16/2021 1131   RDW 13.7 02/16/2021 1131   LYMPHSABS 2.4 04/24/2018 1450   MONOABS 0.7 04/24/2018 1450   EOSABS 0.1 04/24/2018 1450   BASOSABS 0.0 04/24/2018 1450    CMP     Component Value Date/Time   NA 141 02/16/2021 1131   K 4.2 02/16/2021 1131   CL 103 02/16/2021 1131   CO2 29 02/16/2021 1131   GLUCOSE 89 02/16/2021 1131   BUN 10 02/16/2021  1131   CREATININE 0.67 02/16/2021 1131   CALCIUM 9.6 02/16/2021 1131   PROT 7.7 04/21/2020 1028   ALBUMIN 4.3 04/21/2020 1028   AST 22 04/21/2020 1028   ALT 22 04/21/2020 1028   ALKPHOS 107 04/21/2020 1028   BILITOT 0.7 04/21/2020 1028   GFRNONAA >60 09/01/2017 0013   GFRAA >60 09/01/2017 0013    Assessment: 1.  Screening for colorectal cancer: Patient is 14 and never had a colonoscopy 2.  Constipation: For the past 7 months, some better when she remembers MiraLAX  Plan: 1.  Scheduled patient for screening colonoscopy in the [facility] with Dr. Hilarie Fredrickson.  Did provide patient with a detailed list of risks of the procedure and she agrees to proceed.  She will have a 2-day bowel prep given her history of constipation. 2.  Encouraged the patient to use her MiraLAX daily, discussed titration of this up to 4 times a day if needed. 3.  Patient to follow in clinic per recommendations from Dr. Hilarie Fredrickson after time of procedure.  Ellouise Newer, PA-C [facility] 03/02/2021, 3:34 PM  Cc: [place], Betty G, MD

## 2021-03-02 NOTE — Patient Instructions (Signed)
If you are age 61 or older, your body mass index should be between 23-30. Your Body mass index is 28.15 kg/m. If this is out of the aforementioned range listed, please consider follow up with your Primary Care Provider.  If you are age 70 or younger, your body mass index should be between 19-25. Your Body mass index is 28.15 kg/m. If this is out of the aformentioned range listed, please consider follow up with your Primary Care Provider.   You have been scheduled for a colonoscopy. Please follow written instructions given to you at your visit today.  Please pick up your prep supplies at the pharmacy within the next 1-3 days. If you use inhalers (even only as needed), please bring them with you on the day of your procedure.  Take Miralax once daily.  Due to recent changes in healthcare laws, you may see the results of your imaging and laboratory studies on MyChart before your provider has had a chance to review them.  We understand that in some cases there may be results that are confusing or concerning to you. Not all laboratory results come back in the same time frame and the provider may be waiting for multiple results in order to interpret others.  Please give [place] 48 hours in order for your provider to thoroughly review all the results before contacting the office for clarification of your results.   Thank you for choosing me and [facility].  Ellouise Newer, PA-C

## 2021-03-19 ENCOUNTER — Telehealth: Payer: Self-pay | Admitting: Internal Medicine

## 2021-03-19 ENCOUNTER — Encounter: Payer: Self-pay | Admitting: Internal Medicine

## 2021-03-19 ENCOUNTER — Other Ambulatory Visit (INDEPENDENT_AMBULATORY_CARE_PROVIDER_SITE_OTHER): Payer: 59

## 2021-03-19 ENCOUNTER — Other Ambulatory Visit: Payer: Self-pay

## 2021-03-19 ENCOUNTER — Ambulatory Visit (AMBULATORY_SURGERY_CENTER): Payer: 59 | Admitting: Internal Medicine

## 2021-03-19 VITALS — BP 116/74 | HR 58 | Temp 97.7°F | Resp 16

## 2021-03-19 DIAGNOSIS — D125 Benign neoplasm of sigmoid colon: Secondary | ICD-10-CM

## 2021-03-19 DIAGNOSIS — R1013 Epigastric pain: Secondary | ICD-10-CM

## 2021-03-19 DIAGNOSIS — K5900 Constipation, unspecified: Secondary | ICD-10-CM

## 2021-03-19 DIAGNOSIS — Z1211 Encounter for screening for malignant neoplasm of colon: Secondary | ICD-10-CM | POA: Diagnosis present

## 2021-03-19 LAB — CBC
HCT: 43.4 % (ref 36.0–46.0)
Hemoglobin: 14.4 g/dL (ref 12.0–15.0)
MCHC: 33.1 g/dL (ref 30.0–36.0)
MCV: 82.4 fl (ref 78.0–100.0)
Platelets: 249 10*3/uL (ref 150.0–400.0)
RBC: 5.27 Mil/uL — ABNORMAL HIGH (ref 3.87–5.11)
RDW: 13.6 % (ref 11.5–15.5)
WBC: 5.7 10*3/uL (ref 4.0–10.5)

## 2021-03-19 LAB — COMPREHENSIVE METABOLIC PANEL
ALT: 27 U/L (ref 0–35)
AST: 24 U/L (ref 0–37)
Albumin: 4.2 g/dL (ref 3.5–5.2)
Alkaline Phosphatase: 80 U/L (ref 39–117)
BUN: 9 mg/dL (ref 6–23)
CO2: 26 mEq/L (ref 19–32)
Calcium: 9.5 mg/dL (ref 8.4–10.5)
Chloride: 108 mEq/L (ref 96–112)
Creatinine, Ser: 0.63 mg/dL (ref 0.40–1.20)
GFR: 96.28 mL/min (ref >60.00)
Glucose, Bld: 91 mg/dL (ref 70–99)
Potassium: 3.6 mEq/L (ref 3.5–5.1)
Sodium: 142 mEq/L (ref 135–145)
Total Bilirubin: 0.7 mg/dL (ref 0.2–1.2)
Total Protein: 7.7 g/dL (ref 6.0–8.3)

## 2021-03-19 MED ORDER — SODIUM CHLORIDE 0.9 % IV SOLN: 500.0000 mL | Freq: Once | INTRAVENOUS | Status: DC

## 2021-03-19 NOTE — Op Note (Signed)
[facility] Patient Name: Denise Blackburn Procedure Date: 03/19/2021 11:36 AM MRN: [id number] Endoscopist: Jerene Bears , MD Age: 60 Referring MD:  Date of Birth: [birth year] Gender: Female Account #: [id number] Procedure:                Colonoscopy Indications:              Screening for colorectal malignant neoplasm, This                            is the patient's first colonoscopy Medicines:                Monitored Anesthesia Care Procedure:                Pre-Anesthesia Assessment:                           - Prior to the procedure, a History and Physical                            was performed, and patient medications and                            allergies were reviewed. The patient's tolerance of                            previous anesthesia was also reviewed. The risks                            and benefits of the procedure and the sedation                            options and risks were discussed with the patient.                            All questions were answered, and informed consent                            was obtained. Prior Anticoagulants: The patient has                            taken no previous anticoagulant or antiplatelet                            agents. ASA Grade Assessment: II - A patient with                            mild systemic disease. After reviewing the risks                            and benefits, the patient was deemed in                            satisfactory condition to undergo the procedure.  After obtaining informed consent, the colonoscope                            was passed under direct vision. Throughout the                            procedure, the patient's blood pressure, pulse, and                            oxygen saturations were monitored continuously. The                            Olympus PCF-H190DL (#[id number]) Colonoscope was                            introduced through the anus and  advanced to the                            cecum, identified by appendiceal orifice and                            ileocecal valve. The colonoscopy was performed                            without difficulty. The patient tolerated the                            procedure well. The quality of the bowel                            preparation was excellent. The ileocecal valve,                            appendiceal orifice, and rectum were photographed. Scope In: 11:44:17 AM Scope Out: 11:58:09 AM Scope Withdrawal Time: 0 hours 11 minutes 5 seconds  Total Procedure Duration: 0 hours 13 minutes 52 seconds  Findings:                 The digital rectal exam was normal.                           A 5 mm polyp was found in the sigmoid colon. The                            polyp was sessile. The polyp was removed with a                            cold snare. Resection and retrieval were complete.                           Internal hemorrhoids were found during                            retroflexion. The hemorrhoids were small.  The exam was otherwise without abnormality. Complications:            No immediate complications. Estimated Blood Loss:     Estimated blood loss was minimal. Impression:               - One 5 mm polyp in the sigmoid colon, removed with                            a cold snare. Resected and retrieved.                           - Small internal hemorrhoids.                           - The examination was otherwise normal. Recommendation:           - Patient has a contact number available for                            emergencies. The signs and symptoms of potential                            delayed complications were discussed with the                            patient. Return to normal activities tomorrow.                            Written discharge instructions were provided to the                            patient.                            - Resume previous diet.                           - Continue present medications.                           - Continue MiraLax 17 g daily for constipation.                            This is safe to use long term if needed for                            constipation.                           - Await pathology results.                           - Repeat colonoscopy is recommended for                            surveillance. The colonoscopy date will be  determined after pathology results from today's                            exam become available for review. Jerene Bears, MD 03/19/2021 12:00:37 PM This report has been signed electronically.

## 2021-03-19 NOTE — Progress Notes (Signed)
Pt's states no medical or surgical changes since previsit or office visit.  cw vitals and HC IV.  Daughter Langley interpreting.

## 2021-03-19 NOTE — Progress Notes (Signed)
Addendum: Reviewed and agree with assessment and management plan. Saadiq Poche M, MD  

## 2021-03-19 NOTE — Patient Instructions (Signed)
Information on polyps and hemorrhoids given to you today.  Await pathology results.  Continue Miralax 17 mg every day for constipation.  Resume previous diet and medications.  YOU HAD AN ENDOSCOPIC PROCEDURE TODAY AT [facility]:   Refer to the procedure report that was given to you for any specific questions about what was found during the examination.  If the procedure report does not answer your questions, please call your gastroenterologist to clarify.  If you requested that your care partner not be given the details of your procedure findings, then the procedure report has been included in a sealed envelope for you to review at your convenience later.  YOU SHOULD EXPECT: Some feelings of bloating in the abdomen. Passage of more gas than usual.  Walking can help get rid of the air that was put into your GI tract during the procedure and reduce the bloating. If you had a lower endoscopy (such as a colonoscopy or flexible sigmoidoscopy) you may notice spotting of blood in your stool or on the toilet paper. If you underwent a bowel prep for your procedure, you may not have a normal bowel movement for a few days.  Please Note:  You might notice some irritation and congestion in your nose or some drainage.  This is from the oxygen used during your procedure.  There is no need for concern and it should clear up in a day or so.  SYMPTOMS TO REPORT IMMEDIATELY:   Following lower endoscopy (colonoscopy or flexible sigmoidoscopy):  Excessive amounts of blood in the stool  Significant tenderness or worsening of abdominal pains  Swelling of the abdomen that is new, acute  Fever of 100F or higher   For urgent or emergent issues, a gastroenterologist can be reached at any hour by calling [phone]. Do not use MyChart messaging for urgent concerns.    DIET:  We do recommend a small meal at first, but then you may proceed to your regular diet.  Drink plenty of fluids but you  should avoid alcoholic beverages for 24 hours.  ACTIVITY:  You should plan to take it easy for the rest of today and you should NOT DRIVE or use heavy machinery until tomorrow (because of the sedation medicines used during the test).    FOLLOW UP: Our staff will call the number listed on your records 48-72 hours following your procedure to check on you and address any questions or concerns that you may have regarding the information given to you following your procedure. If we do not reach you, we will leave a message.  We will attempt to reach you two times.  During this call, we will ask if you have developed any symptoms of COVID 19. If you develop any symptoms (ie: fever, flu-like symptoms, shortness of breath, cough etc.) before then, please call [phone].  If you test positive for Covid 19 in the 2 weeks post procedure, please call and report this information to [place].    If any biopsies were taken you will be contacted by phone or by letter within the next 1-3 weeks.  Please call us at [phone] if you have not heard about the biopsies in 3 weeks.    SIGNATURES/CONFIDENTIALITY: You and/or your care partner have signed paperwork which will be entered into your electronic medical record.  These signatures attest to the fact that that the information above on your After Visit Summary has been reviewed and is understood.  Full responsibility of the  confidentiality of this discharge information lies with you and/or your care-partner. 

## 2021-03-19 NOTE — Telephone Encounter (Signed)
Patient here for colonoscopy for screening and constipation. In recovery the patient's daughter provided and asked additional questions about new symptoms not addressed in clinic visit with Ellouise Newer, PAC.  The patient's daughter reports epigastric burning discomfort worse after eating.  This has been going on for several months.  Mild nausea at times but no vomiting.  No dysphagia.  No definitive heartburn.  She has pantoprazole on her med list which her daughter feels that she is likely taking.  Plan: CMP, CBC and H. pylori stool antigen If unrevealing consider upper endoscopy Patient is status post laparoscopic cholecystectomy in 2019 for symptomatic cholelithiasis Patient will get lab work on the way home today and bring back stool test for H. pylori once bowel movements resume after colonoscopy.

## 2021-03-19 NOTE — Progress Notes (Signed)
PT taken to PACU. Monitors in place. VSS. Report given to RN. 

## 2021-03-19 NOTE — Progress Notes (Signed)
Called to room to assist during endoscopic procedure.  Patient ID and intended procedure confirmed with present staff. Received instructions for my participation in the procedure from the performing physician.  

## 2021-03-23 ENCOUNTER — Other Ambulatory Visit: Payer: 59

## 2021-03-23 ENCOUNTER — Telehealth: Payer: Self-pay | Admitting: *Deleted

## 2021-03-23 DIAGNOSIS — R1013 Epigastric pain: Secondary | ICD-10-CM

## 2021-03-23 NOTE — Telephone Encounter (Signed)
  Follow up Call-  Call back number 03/19/2021  Post procedure Call Back phone  # [phone] call daughter  Permission to leave phone message Yes  Some recent data might be hidden    Spoke with Vicky Patient questions:  Do you have a fever, pain , or abdominal swelling? No. Pain Score  0 *  Have you tolerated food without any problems? Yes.    Have you been able to return to your normal activities? Yes.    Do you have any questions about your discharge instructions: Diet   No. Medications  No. Follow up visit  No.  Do you have questions or concerns about your Care? No.  Actions: * If pain score is 4 or above: No action needed, pain <4.  1. Have you developed a fever since your procedure? no  2.   Have you had an respiratory symptoms (SOB or cough) since your procedure? no  3.   Have you tested positive for COVID 19 since your procedure no  4.   Have you had any family members/close contacts diagnosed with the COVID 19 since your procedure?  no   If yes to any of these questions please route to Joylene John, RN and Joella Prince, RN

## 2021-03-25 LAB — H. PYLORI ANTIGEN, STOOL: H pylori Ag, Stl: NEGATIVE

## 2021-03-29 ENCOUNTER — Encounter: Payer: Self-pay | Admitting: Internal Medicine

## 2022-11-07 NOTE — Progress Notes (Deleted)
HPI: Denise Blackburn is a 62 y.o. female, who is here today for her routine physical.  Last CPE: 04/21/20  Regular exercise 3 or more time per week: *** Following a healthy diet: ***  Chronic medical problems: ***  Immunization History  Administered Date(s) Administered   Tdap 04/21/2020   Health Maintenance  Topic Date Due   PAP SMEAR-Modifier  Never done   Zoster Vaccines- Shingrix (1 of 2) Never done   INFLUENZA VACCINE  Never done   MAMMOGRAM  06/16/2022   COLONOSCOPY (Pts 45-16yr Insurance coverage will need to be confirmed)  03/19/2028   DTaP/Tdap/Td (2 - Td or Tdap) 04/21/2030   Hepatitis C Screening  Completed   HPV VACCINES  Aged Out   HIV Screening  Discontinued    She has *** concerns today.  Review of Systems  Current Outpatient Medications on File Prior to Visit  Medication Sig Dispense Refill   aluminum-magnesium hydroxide-simethicone (MAALOX) 200-200-20 MG/5ML SUSP Take 30 mLs by mouth 2 (two) times daily as needed. 1680 mL 0   amitriptyline (ELAVIL) 25 MG tablet TAKE 1 TABLET BY MOUTH EVERYDAY AT BEDTIME (Patient not taking: Reported on 03/19/2021) 90 tablet 1   amLODipine (NORVASC) 2.5 MG tablet TAKE 1 TABLET BY MOUTH EVERY DAY (Patient not taking: Reported on 03/19/2021) 90 tablet 1   methocarbamol (ROBAXIN) 500 MG tablet Take 1 tablet (500 mg total) by mouth every 8 (eight) hours as needed for muscle spasms. (Patient not taking: Reported on 03/19/2021) 30 tablet 0   naproxen (NAPROSYN) 500 MG tablet Take 1 tablet (500 mg total) by mouth 2 (two) times daily with a meal. 30 tablet 0   pantoprazole (PROTONIX) 20 MG tablet Take 1 tablet (20 mg total) by mouth daily. 30 tablet 0   PEG-KCl-NaCl-NaSulf-Na Asc-C (PLENVU) 140 g SOLR Use as directed 1 each 0   polyethylene glycol (MIRALAX / GLYCOLAX) 17 g packet Take 17 g by mouth daily. 14 each 0   Current Facility-Administered Medications on File Prior to Visit  Medication Dose Route Frequency Provider Last Rate  Last Admin   0.9 %  sodium chloride infusion  500 mL Intravenous Once Pyrtle, JLajuan Lines MD        Past Medical History:  Diagnosis Date   Asthma    7 yrs ago.  She does not use any inhalers   GERD (gastroesophageal reflux disease)    E[place] performed 15[place]17 yrs ago [place]  Headache    migraines   Hypertension     Past Surgical History:  Procedure Laterality Date   CHOLECYSTECTOMY N/A 10/26/2017   Procedure: LAPAROSCOPIC CHOLECYSTECTOMY;  Surgeon: TGeorganna Skeans MD;  Location: [facility]  Service: General;  Laterality: N/A;    No Known Allergies  Family History  Problem Relation Age of Onset   Colon cancer Neg Hx    Esophageal cancer Neg Hx    Rectal cancer Neg Hx    Prostate cancer Neg Hx     Social History   Socioeconomic History   Marital status: Married    Spouse name: Not on file   Number of children: Not on file   Years of education: Not on file   Highest education level: Not on file  Occupational History   Not on file  Tobacco Use   Smoking status: Never   Smokeless tobacco: Never  Vaping Use   Vaping Use: Never used  Substance and Sexual Activity   Alcohol use: No   Drug use:  No   Sexual activity: Not on file  Other Topics Concern   Not on file  Social History Narrative   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 There is no height or weight on file to calculate BMI.  Wt Readings from Last 3 Encounters:  03/02/21 149 lb (67.6 kg)  02/16/21 148 lb 12.8 oz (67.5 kg)  05/01/20 149 lb (67.6 kg)    Physical Exam  ASSESSMENT AND PLAN: Denise Blackburn was here today annual physical examination.  No orders of the defined types were placed in this encounter.   There are no  diagnoses linked to this encounter.  There are no diagnoses linked to this encounter.  No follow-ups on file.  Betty G. Martin[place], MD  [facility]. [place] office.

## 2022-11-08 ENCOUNTER — Ambulatory Visit (INDEPENDENT_AMBULATORY_CARE_PROVIDER_SITE_OTHER): Payer: 59 | Admitting: Family Medicine

## 2022-11-08 ENCOUNTER — Encounter: Payer: Self-pay | Admitting: Family Medicine

## 2022-11-08 ENCOUNTER — Encounter: Payer: Medicaid Other | Admitting: Family Medicine

## 2022-11-08 VITALS — BP 120/86 | HR 70 | Temp 97.8°F | Ht 61.0 in | Wt 148.5 lb

## 2022-11-08 DIAGNOSIS — K5909 Other constipation: Secondary | ICD-10-CM

## 2022-11-08 DIAGNOSIS — K219 Gastro-esophageal reflux disease without esophagitis: Secondary | ICD-10-CM

## 2022-11-08 DIAGNOSIS — R1310 Dysphagia, unspecified: Secondary | ICD-10-CM

## 2022-11-08 DIAGNOSIS — Z0000 Encounter for general adult medical examination without abnormal findings: Secondary | ICD-10-CM

## 2022-11-08 MED ORDER — PANTOPRAZOLE SODIUM 40 MG PO TBEC: 40.0000 mg | DELAYED_RELEASE_TABLET | Freq: Every day | ORAL | 1 refills | Status: DC

## 2022-11-08 NOTE — Progress Notes (Signed)
Established Patient Office Visit  Subjective   Patient ID: Denise Blackburn, female    DOB: 1960-12-21  Age: 62 y.o. MRN: [id number]  Chief Complaint  Patient presents with   Gastroesophageal Reflux    HPI   Patient is seen with help of interpreter.  Patient speaks [place].  Relates several month history of intermittent sensation of reflux substernal area.  She states that occasionally food seems to have slow transit down her esophagus.  No pain with swallowing.  She relates decreased appetite but no weight loss.  She apparently takes Protonix 20 mg once daily.  Has never had to regurgitate food.  Currently did have EGD about 15 years ago in [place] but we have no records of that.  No exertional chest pain.  Also complains of constipation.  Has used things like MiraLAX in the past without much improvement.  Some relief with prune juice.  Denies any recent melena.  No diarrhea symptoms.  Past Medical History:  Diagnosis Date   Asthma    7 yrs ago.  She does not use any inhalers   GERD (gastroesophageal reflux disease)    EGD performed 15-17 yrs ago in [place]   Headache    migraines   Hypertension    Past Surgical History:  Procedure Laterality Date   CHOLECYSTECTOMY N/A 10/26/2017   Procedure: LAPAROSCOPIC CHOLECYSTECTOMY;  Surgeon: Georganna Skeans, MD;  Location: [facility];  Service: General;  Laterality: N/A;    reports that she has never smoked. She has never used smokeless tobacco. She reports that she does not drink alcohol and does not use drugs. family history is not on file. No Known Allergies  Review of Systems  Constitutional:  Negative for chills, fever and weight loss.  Respiratory:  Negative for cough.   Cardiovascular:  Negative for chest pain.  Gastrointestinal:  Positive for constipation. Negative for blood in stool, melena, nausea and vomiting.      Objective:     BP 120/86 (BP Location: Left Arm, Patient Position: Sitting, Cuff Size: Normal)   Pulse 70    Temp 97.8 F (36.6 C) (Oral)   Ht '5\' 1"'$  (1.549 m)   Wt 148 lb 8 oz (67.4 kg)   LMP 04/07/2018   SpO2 99%   BMI 28.06 kg/m  BP Readings from Last 3 Encounters:  11/08/22 120/86  03/19/21 116/74  03/02/21 116/74   Wt Readings from Last 3 Encounters:  11/08/22 148 lb 8 oz (67.4 kg)  03/02/21 149 lb (67.6 kg)  02/16/21 148 lb 12.8 oz (67.5 kg)      Physical Exam Vitals reviewed.  Constitutional:      Appearance: Normal appearance.  Cardiovascular:     Rate and Rhythm: Normal rate and regular rhythm.  Pulmonary:     Effort: Pulmonary effort is normal.     Breath sounds: Normal breath sounds. No wheezing or rales.  Abdominal:     General: Bowel sounds are normal. There is no distension.     Palpations: Abdomen is soft. There is no mass.     Tenderness: There is no abdominal tenderness. There is no guarding or rebound.  Neurological:     Mental Status: She is alert.      No results found for any visits on 11/08/22.    The 10-year ASCVD risk score (Arnett DK, et al., 2019) is: 4.8%    Assessment & Plan:   #1 intermittent dysphagia with longstanding history of GERD symptoms.  Does not have any  red flags such as weight loss.  She is describing what sounds like some ongoing GERD symptoms.  May have early stricture.  -Increase Protonix to 40 mg daily -Discussed dietary factors with handout given -If symptoms not improving over the next few weeks with step up and PPI therapy recommend consideration for either upper GI barium swallow versus a referral to GI for possible EGD  #2 constipation.  She apparently had longstanding history of intermittent constipation.  Previous thyroid functions have been normal.  She does have Elavil 25 mg on her med list. -Continue MiraLAX as needed -Increase fluid intake -Aim for at least 25 g of fiber per day -Probably needs to look at slowly tapering off Elavil for possible and will defer to primary  Carolann Littler, MD

## 2022-11-21 NOTE — Progress Notes (Unsigned)
HPI: [name] is a 62 y.o. female, who is here today for her routine physical.  Last CPE: 2021 Interpreter is present. Regular exercise 3 or more time per week: *** Following a healthy diet: ***  Chronic medical problems: ***  Immunization History  Administered Date(s) Administered   Tdap 04/21/2020   Health Maintenance  Topic Date Due   PAP SMEAR-Modifier  Never done   Zoster Vaccines- Shingrix (1 of 2) Never done   MAMMOGRAM  06/16/2022   COVID-19 Vaccine (1) 12/07/2022 (Originally 01/31/1962)   INFLUENZA VACCINE  01/22/2023 (Originally 05/24/2022)   COLONOSCOPY (Pts 45-38yr Insurance coverage will need to be confirmed)  03/19/2028   DTaP/Tdap/Td (2 - Td or Tdap) 04/21/2030   Hepatitis C Screening  Completed   HPV VACCINES  Aged Out   HIV Screening  Discontinued    She has *** concerns today. Lab Results  Component Value Date   CHOL 182 04/21/2020   HDL 48.30 04/21/2020   LDLCALC 115 (Blackburn) 04/21/2020   LDLDIRECT 146.7 06/21/2012   TRIG 94.0 04/21/2020   CHOLHDL 4 04/21/2020   Heartburn for a year and bloating. Constipation. *** Review of Systems  No current outpatient medications on file prior to visit.   No current facility-administered medications on file prior to visit.    Past Medical History:  Diagnosis Date   Asthma    7 yrs ago.  She does not use any inhalers   GERD (gastroesophageal reflux disease)    [place]  Headache    migraines   Hypertension     Past Surgical History:  Procedure Laterality Date   CHOLECYSTECTOMY N/A 10/26/2017   Procedure: LAPAROSCOPIC CHOLECYSTECTOMY;  Surgeon: TGeorganna Skeans MD;  [place] [facility]  Service: General;  Laterality: N/A;    No Known Allergies  Family History  Problem Relation Age of Onset   Colon cancer Neg Hx    Esophageal cancer Neg Hx    Rectal cancer Neg Hx    Prostate cancer Neg Hx     Social History   Socioeconomic History   Marital status: Married     Spouse name: Not on file   Number of children: Not on file   Years of education: Not on file   Highest education level: Not on file  Occupational History   Not on file  Tobacco Use   Smoking status: Never   Smokeless tobacco: Never  Vaping Use   Vaping Use: Never used  Substance and Sexual Activity   Alcohol use: No   Drug use: No   Sexual activity: Not on file  Other Topics Concern   Not on file  Social History Narrative   Housewife   Married for 289years   3 daughters   Social Determinants of Health   Financial Resource Strain: Not on file  Food Insecurity: Not on file  Transportation Needs: Not on file  Physical Activity: Not on file  Stress: Not on file  Social Connections: Not on file    Vitals:   11/22/22 1001  BP: 120/70  Pulse: 82  Resp: 16  Temp: 98.2 F (36.8 C)  SpO2: 98%   Body mass index is 28.6 kg/m.  Wt Readings from Last 3 Encounters:  11/22/22 151 lb 6 oz (68.7 kg)  11/08/22 148 lb 8 oz (67.4 kg)  03/02/21 149 lb (67.6 kg)    Physical Exam Vitals and nursing note reviewed.  Constitutional:  General: She is not in acute distress.    Appearance: She is well-developed.  HENT:     Head: Normocephalic and atraumatic.     Right Ear: Hearing, tympanic membrane, ear canal and external ear normal.     Left Ear: Hearing, tympanic membrane, ear canal and external ear normal.     Mouth/Throat:     Mouth: Mucous membranes are moist.     Pharynx: Oropharynx is clear. Uvula midline.  Eyes:     Extraocular Movements: Extraocular movements intact.     Conjunctiva/sclera: Conjunctivae normal.     Pupils: Pupils are equal, round, and reactive to light.  Neck:     Thyroid: Thyromegaly present.     Trachea: No tracheal deviation.  Cardiovascular:     Rate and Rhythm: Normal rate and regular rhythm.     Pulses:          Dorsalis pedis pulses are 2+ on the right side and 2+ on the left side.       Posterior tibial pulses are 2+ on the right side  and 2+ on the left side.     Heart sounds: No murmur heard. Pulmonary:     Effort: Pulmonary effort is normal. No respiratory distress.     Breath sounds: Normal breath sounds.  Abdominal:     Palpations: Abdomen is soft. There is no hepatomegaly or mass.     Tenderness: There is no abdominal tenderness.  Genitourinary:       Comments: Deferred to gyn. Musculoskeletal:     Comments: No major deformity or signs of synovitis appreciated.  Lymphadenopathy:     Cervical: No cervical adenopathy.     Upper Body:     Right upper body: No supraclavicular adenopathy.     Left upper body: No supraclavicular adenopathy.  Skin:    General: Skin is warm.     Findings: No erythema or rash.  Neurological:     General: No focal deficit present.     Mental Status: She is alert and oriented to person, place, and time.     Cranial Nerves: No cranial nerve deficit.     Coordination: Coordination normal.     Gait: Gait normal.     Deep Tendon Reflexes:     Reflex Scores:      Bicep reflexes are 2+ on the right side and 2+ on the left side.      Patellar reflexes are 2+ on the right side and 2+ on the left side. Psychiatric:     Comments: Well groomed, good eye contact.    ASSESSMENT AND PLAN: Ms. Denise Blackburn was here today annual physical examination.  Orders Placed This Encounter  Procedures   Mammogram Digital Screening   DEXAScan   US THYROID   Comprehensive metabolic panel   Lipid panel   TSH   Ambulatory referral to Gynecology    Denise Blackburn was seen today for annual exam.  Diagnoses and all orders for this visit:  Routine general medical examination at a health care facility  Primary hypertension -     Comprehensive metabolic panel; Future -     TSH; Future -     TSH -     Comprehensive metabolic panel  Hyperlipidemia, unspecified hyperlipidemia type -     Lipid panel; Future -     Lipid panel  Asymptomatic postmenopausal estrogen deficiency -     DEXAScan;  Future  Encounter for screening for malignant neoplasm of breast, unspecified screening  modality -     Mammogram Digital Screening; Future  Cervical cancer screening -     Cytology - PAP ([place])  Enlarged thyroid gland -     US THYROID; Future  Lesion of cervix -     Ambulatory referral to Gynecology  Constipation, unspecified constipation type  Gastroesophageal reflux disease, unspecified whether esophagitis present -     pantoprazole (PROTONIX) 40 MG tablet; Take 1 tablet (40 mg total) by mouth daily before breakfast.  Essential hypertension -     amLODipine (NORVASC) 2.5 MG tablet; Take 1 tablet (2.5 mg total) by mouth daily.    Routine general medical examination at a health care facility  Primary hypertension -     Comprehensive metabolic panel; Future -     TSH; Future  Hyperlipidemia, unspecified hyperlipidemia type -     Lipid panel; Future  Asymptomatic postmenopausal estrogen deficiency -     DG Bone Density; Future  Encounter for screening for malignant neoplasm of breast, unspecified screening modality -     Digital Screening Mammogram, Left and Right; Future  Cervical cancer screening -     Cytology - PAP  Enlarged thyroid gland -     US THYROID; Future  Lesion of cervix -     Ambulatory referral to Gynecology  Constipation, unspecified constipation type  Gastroesophageal reflux disease, unspecified whether esophagitis present -     Pantoprazole Sodium; Take 1 tablet (40 mg total) by mouth daily before breakfast.  Dispense: 30 tablet; Refill: 3  Essential hypertension -     amLODIPine Besylate; Take 1 tablet (2.5 mg total) by mouth daily.  Dispense: 90 tablet; Refill: 1    Return in 1 year (on 11/23/2023) for CPE.  Hibo Blasdell G. [place], MD  [facility] [place] office.

## 2022-11-22 ENCOUNTER — Other Ambulatory Visit (HOSPITAL_COMMUNITY)
Admission: RE | Admit: 2022-11-22 | Discharge: 2022-11-22 | Disposition: A | Discharge status: Home / Self Care | Payer: 59 | Source: Ambulatory Visit | Attending: Family Medicine | Admitting: Family Medicine

## 2022-11-22 ENCOUNTER — Encounter: Payer: Self-pay | Admitting: Family Medicine

## 2022-11-22 ENCOUNTER — Ambulatory Visit (INDEPENDENT_AMBULATORY_CARE_PROVIDER_SITE_OTHER): Payer: 59 | Admitting: Family Medicine

## 2022-11-22 VITALS — BP 120/70 | HR 82 | Temp 98.2°F | Resp 16 | Ht 61.0 in | Wt 151.4 lb

## 2022-11-22 DIAGNOSIS — K5900 Constipation, unspecified: Secondary | ICD-10-CM | POA: Diagnosis not present

## 2022-11-22 DIAGNOSIS — K219 Gastro-esophageal reflux disease without esophagitis: Secondary | ICD-10-CM | POA: Diagnosis not present

## 2022-11-22 DIAGNOSIS — Z124 Encounter for screening for malignant neoplasm of cervix: Secondary | ICD-10-CM

## 2022-11-22 DIAGNOSIS — E049 Nontoxic goiter, unspecified: Secondary | ICD-10-CM | POA: Diagnosis not present

## 2022-11-22 DIAGNOSIS — E042 Nontoxic multinodular goiter: Secondary | ICD-10-CM | POA: Insufficient documentation

## 2022-11-22 DIAGNOSIS — Z1239 Encounter for other screening for malignant neoplasm of breast: Secondary | ICD-10-CM | POA: Diagnosis not present

## 2022-11-22 DIAGNOSIS — N889 Noninflammatory disorder of cervix uteri, unspecified: Secondary | ICD-10-CM | POA: Diagnosis not present

## 2022-11-22 DIAGNOSIS — E785 Hyperlipidemia, unspecified: Secondary | ICD-10-CM | POA: Diagnosis not present

## 2022-11-22 DIAGNOSIS — Z780 Asymptomatic menopausal state: Secondary | ICD-10-CM | POA: Diagnosis not present

## 2022-11-22 DIAGNOSIS — I10 Essential (primary) hypertension: Secondary | ICD-10-CM | POA: Diagnosis not present

## 2022-11-22 DIAGNOSIS — Z0000 Encounter for general adult medical examination without abnormal findings: Secondary | ICD-10-CM | POA: Diagnosis not present

## 2022-11-22 LAB — COMPREHENSIVE METABOLIC PANEL
ALT: 42 U/L — ABNORMAL HIGH (ref 0–35)
AST: 32 U/L (ref 0–37)
Albumin: 4.3 g/dL (ref 3.5–5.2)
Alkaline Phosphatase: 95 U/L (ref 39–117)
BUN: 9 mg/dL (ref 6–23)
CO2: 26 mEq/L (ref 19–32)
Calcium: 9.5 mg/dL (ref 8.4–10.5)
Chloride: 104 mEq/L (ref 96–112)
Creatinine, Ser: 0.6 mg/dL (ref 0.40–1.20)
GFR: 96.28 mL/min (ref >60.00)
Glucose, Bld: 100 mg/dL — ABNORMAL HIGH (ref 70–99)
Potassium: 3.5 mEq/L (ref 3.5–5.1)
Sodium: 140 mEq/L (ref 135–145)
Total Bilirubin: 0.4 mg/dL (ref 0.2–1.2)
Total Protein: 8.3 g/dL (ref 6.0–8.3)

## 2022-11-22 LAB — LIPID PANEL
Cholesterol: 213 mg/dL — ABNORMAL HIGH (ref 0–200)
HDL: 61.9 mg/dL (ref >39.00)
LDL Cholesterol: 131 mg/dL — ABNORMAL HIGH (ref 0–99)
NonHDL: 150.67
Total CHOL/HDL Ratio: 3
Triglycerides: 97 mg/dL (ref 0.0–149.0)
VLDL: 19.4 mg/dL (ref 0.0–40.0)

## 2022-11-22 LAB — TSH: TSH: 0.94 u[IU]/mL (ref 0.35–5.50)

## 2022-11-22 MED ORDER — PANTOPRAZOLE SODIUM 40 MG PO TBEC: 40.0000 mg | DELAYED_RELEASE_TABLET | Freq: Every day | ORAL | 3 refills | Status: DC

## 2022-11-22 MED ORDER — AMLODIPINE BESYLATE 2.5 MG PO TABS: 2.5000 mg | ORAL_TABLET | Freq: Every day | ORAL | 1 refills | Status: DC

## 2022-11-22 NOTE — Patient Instructions (Addendum)
A few things to remember from today's visit:  Routine general medical examination at a health care facility  Primary hypertension - Plan: Comprehensive metabolic panel, TSH  Hyperlipidemia, unspecified hyperlipidemia type - Plan: Lipid panel  Asymptomatic postmenopausal estrogen deficiency - Plan: DEXAScan  Encounter for screening for malignant neoplasm of breast, unspecified screening modality - Plan: Mammogram Digital Screening  Cervical cancer screening - Plan: Cytology - PAP ([place])  Enlarged thyroid gland - Plan: US THYROID  Lesion of cervix - Plan: Ambulatory referral to Gynecology Ch?m [place] phng ng?a 40-64 tu?i, N? gi?i Preventive Care 30-61 Years Old, Female Ch?m [place] phng ng?a ?? c?p ??n cc l?a ch?n l?i s?ng v cc l?n khm v?i chuyn gia ch?m [place] s?c kh?e c th? t?ng c??ng s?c kh?e v h?nh phc. Cc l?n khm ch?m [place] phng ng?a cn ???c g?i l khm s?c kh?e. Ti c th? d? ki?n nh?ng g ??i v?i l?n khm ch?m [place] phng ng?a c?a ti? T? v?n Chuyn gia ch?m [place] s?c kh?e c th? h?i qu v? v?: B?nh s?, bao g?m: Cc v?n ?? b?nh l tr??c ?y. B?nh s? gia ?nh. Ti?n s? mang thai. S?c kh?e hi?n t?i, bao g?m: Chu k? kinh nguy?t. Ph??ng php ng?a [place]. Tnh tr?ng s?c kh?e tinh th?n. Cu?c s?ng ? nh v tnh tr?ng h?nh phc trong m?i quan h?. Quan h? tnh d?c v s?c kh?e tnh d?c. L?i s?ng, bao g?m: S? d?ng r??u, nicotine ho?c thu?c l v ma ty. Ti?p c?n v?i v? kh. Cc thi quen ?n, t?p luy?n v ng?. Cng vi?c v mi tr??ng lm vi?c. S? d?ng kem ch?ng n?ng. Cc v?n ?? an ton nh? th?t dy an ton v ??i m? b?o hi?m xe ??p. Khm th?c th? Chuyn gia ch?m [place] s?c kh?e s? ki?m tra qu v? v?: Chi?u cao v cn n?ng. Nh?ng thng s? ny c th? ???c s? d?ng ?? tnh ton BMI (ch? s? kh?i c? th?) c?a qu v?. BMI l m?t s? ?o cho bi?t qu v? c cn n?ng t?t cho s?c kh?e hay khng. Vng eo. ?y l ?o vng eo c?a qu v?. Php ?o ny c?ng cho bi?t li?u qu v? c cn n?ng t?t cho s?c  kh?e khng v c th? gip d? ?on nguy c? m?c m?t s? b?nh, ch?ng h?n nh? ti?u ???ng tup 2 v huy?t p cao. Nh?p tim v huy?t p. Thn nhi?t. Da xem c cc ??m b?t th??ng hay khng. Ti c?n ch?ng ng?a nh?ng g?  V?c xin th??ng ???c tim ? cc ?? tu?i khc nhau, theo l?ch. Chuyn gia ch?m [place] s?c kh?e s? khuy?n ngh? cc lo?i v?c xin dnh cho qu v? d?a trn tu?i, b?nh s? v l?i s?ng ho?c cc y?u t? khc, ch?ng h?n nh? ?i l?i ho?c n?i qu v? lm vi?c. Ti c?n lm cc xt nghi?m no? Sng l?c Chuyn gia ch?m [place] s?c kh?e c th? khuy?n ngh? cc ki?m tra sng l?c cho m?t s? tnh tr?ng nh?t ??nh. Vi?c ny c th? bao g?m: N?ng ?? lipid v cholesterol. Sng l?c ti?u ???ng. Vi?c ny ???c th?c hi?n b?ng cch ki?m tra ???ng huy?t (glucose) sau khi qu v? khng ?n g trong m?t kho?ng th?i gian (nh?n ?i). Khm vng ch?u v xt nghi?m ph?t t? bo c? t? cung (Pap). Xt nghi?m vim gan B. Xt nghi?m vim gan C. Xt nghi?m HIV (vi rt gy suy gi?m mi?n d?ch ? ng??i). Xt nghi?m STI (b?nh nhi?m trng ly truy?n qua ???  ng tnh d?c), n?u qu v? c nguy c?. Sng l?c ung th? ph?i. Sng l?c ung th? ??i tr?c trng. Ch?p X quang tuy?n v. Hy trao ??i v?i chuyn gia ch?m [place] s?c kh?e v? vi?c khi no qu v? nn b?t ??u ch?p X quang tuy?n v th??ng xuyn. ?i?u ny c th? ph? thu?c vo vi?c qu v? c ti?n s? gia ?nh b? ung th? v hay khng. Sng l?c ung th? lin quan ??n BRCA. Xt nghi?m ny c th? ???c th?c hi?n n?u qu v? c ti?n s? gia ?nh b? ung th? v, ung th? bu?ng tr?ng, ung th? ?ng d?n tr?ng ho?c ung th? phc m?c. Ch?p m?t ?? x??ng. Ki?m tra ny ???c th?c hi?n ?? sng l?c b?nh long x??ng. Trao ??i v?i chuyn gia ch?m [place] s?c kh?e cu?a qu v? v? cc k?t qu? xt nghi?m, cc l?a ch?n ?i?u tr? v n?u c?n, nhu c?u lm thm cc ki?m tra. Tun th? nh?ng h??ng d?n ny ? nh: ?n v u?ng  ?n ch? ?? ?n bao g?m tri cy v rau c? t??i, ng? c?c nguyn h?t, protein t? th?t n?c v cc s?n ph?m t? s?a t ch?t bo. Dng  th?c ph?m ch?c n?ng c vitamin v ch?t khong theo khuy?n ngh? c?a chuyn gia ch?m [place] s?c kh?e. Khng u?ng r??u n?u: Chuyn gia ch?m [place] s?c kh?e khuyn qu v? khng u?ng r??u. Qu v? c [place], c th? c [place], ho?c ?ang c k? ho?ch c [place]. N?u qu v? u?ng r??u: Gi?i h?n l??ng r??u qu v? u?ng ? 0-1 ly m?i ngy. Bi?t m?t ly c bao nhiu r??u. ? M?, m?t ly t??ng ???ng v?i m?t chai bia 12 ao x? (355 mL), m?t ly r??u vang 5 ao x? (148 mL), ho?c m?t ly r??u m?nh 1 ao x? (44 mL). L?i s?ng ?nh r?ng m?i sng v t?i b?ng kem ?nh r?ng c flo. Dng ch? nha khoa lm s?ch k? r?ng m?i ngy m?t l?n. T?p th? d?c t nh?t 30 pht, t? 5 ngy tr? ln m?i tu?n. Khng s? d?ng b?t k? s?n ph?m no c nicotine ho?c thu?c l. Nh?ng s?n ph?m ny bao g?m thu?c l d?ng ht, thu?c l d?ng nhai v d?ng c? ht thu?c, ch?ng h?n nh? thu?c l ?i?n t?. N?u qu v? c?n gip ?? ?? cai thu?c, hy h?i chuyn gia ch?m [place] s?c kh?e. Khng s? d?ng ma ty. N?u qu v? c quan h? tnh d?c, hy th?c hnh quan h? tnh d?c an ton. S? d?ng bao cao su ho?c hnh th?c b?o v? khc ?? phng ng?a STI. N?u qu v? khng mu?n c [place], hy s? d?ng m?t bi?n php trnh [place]. N?u qu v? d? ??nh c [place], hy g?p chuyn gia ch?m [place] s?c kh?e ?? khm tr??c khi mang [place]. Ch? s? d?ng aspirin theo ch? d?n c?a chuyn gia ch?m [place] s?c kh?e cu?a quy? vi?. Hy ch?c ch?n r?ng qu v? hi?u r c?n dng bao nhiu v dng d?ng no. Lm vi?c v?i chuyn gia ch?m [place] s?c kh?e ?? tm hi?u xem vi?c dng aspirin hng ngy c an ton v c l?i cho qu v? hay khng. Tm cc cch lnh m?nh ?? qu?n l c?ng th?ng, ch?ng h?n nh?: Thi?n, yoga, ho?c nghe nh?c. Vi?t nh?t k. Ni chuy?n v?i m?t ng??i ?ng tin c?y. Dnh th?i gian cho b?n b v gia ?nh. Gi?m thi?u ti?p xc v?i b?c x? UV ?? gi?m nguy c? ung th? da. An  [place] th?t dy an ton trong khi li xe ho?c ng?i trn ph??ng ti?n ?i l?i. Khng li xe: N?u qu v? ? u?ng r??u. Khng ?i xe cng v?i m?t ng??i v?a u?ng  r??u. Khi qu v? m?t m?i ho?c r?i tr. Trong khi nh?n tin. N?u qu v? ? s? d?ng b?t k? ch?t lm thay ??i tm tr ho?c ma ty. ??i m? b?o hi?m v cc ?? b?o v? khc trong khi tham gia ho?t ??ng th? thao. N?u qu v? c v? kh trong nh, hy ??m b?o qu v? lm theo t?t c? cc quy trnh an ton v?i sng. Tm ki?m s? tr? gip n?u qu v? ? b? l?m d?ng th? xc ho?c l?m d?ng tnh d?c. C?n lm g ti?p theo? ??n khm v?i chuyn gia ch?m [place] s?c kh?e m?i n?m m?t l?n ?? ki?m tra s?c kh?e th??ng nin. Hy h?i chuyn gia ch?m [place] s?c kh?e v? vi?c qu v? nn khm m?t v r?ng bao lu m?t l?n. Tim v?c xin ??y ??Tera Mater tin ny khng nh?m m?c ?ch thay th? cho l?i khuyn m chuyn gia ch?m [place] s?c kh?e ni v?i qu v?. Hy b?o ??m qu v? ph?i th?o lu?n b?t k? v?n ?? g m qu v? c v?i chuyn gia ch?m [place] s?c kh?e c?a qu v?. Document Revised: 05/12/2021 Document Reviewed: 05/12/2021 Elsevier Patient Education  [place].  If you need refills for medications you take chronically, please call your pharmacy. Do not use My Chart to request refills or for acute issues that need immediate attention. If you send a my chart message, it may take a few days to be addressed, specially if I am not in the office.  Please be sure medication list is accurate. If a new problem present, please set up appointment sooner than planned today.

## 2022-11-24 ENCOUNTER — Telehealth: Payer: Self-pay

## 2022-11-24 DIAGNOSIS — K5900 Constipation, unspecified: Secondary | ICD-10-CM | POA: Insufficient documentation

## 2022-11-24 NOTE — Assessment & Plan Note (Signed)
Unchanged from last visit. Thyroid US and TSH ordered today, further recommendations will be given according to results.

## 2022-11-24 NOTE — Assessment & Plan Note (Signed)
We discussed the importance of regular physical activity and healthy diet for prevention of chronic illness and/or complications. Preventive guidelines reviewed. Vaccination to be given at the [facility]. Ca++ and vit D supplementation recommended. Next CPE in a year.

## 2022-11-24 NOTE — Assessment & Plan Note (Addendum)
Increase fiber and fluid intake. OTC Miralax daily as needed. Colonoscopy in 02/2021. Instructed about warning signs.

## 2022-11-24 NOTE — Assessment & Plan Note (Signed)
Noted on examination, polyp. Discussed findings. Referred to gyn for incisional Bx.

## 2022-11-24 NOTE — Assessment & Plan Note (Signed)
Resume Protonix 40 mg daily 30 minutes before breakfast for 6-8 weeks then as needed. GERD precautions recommended.

## 2022-11-24 NOTE — Assessment & Plan Note (Signed)
BP adequately controlled. Continue Amlodipine 2.5 mg daily and low salt diet. Recommend periodic eye exam. If she monitors BP at home and in normal range, annual follow up is appropriate.

## 2022-11-24 NOTE — Assessment & Plan Note (Signed)
Non pharmacologic treatment recommended for now. Further recommendations will be given according to 10 years CVD risk score and lipid panel numbers. 

## 2022-11-29 LAB — CYTOLOGY - PAP
Chlamydia: NEGATIVE
Comment: NEGATIVE
Comment: NEGATIVE
Comment: NEGATIVE
Comment: NORMAL
Diagnosis: NEGATIVE
High risk HPV: NEGATIVE
Neisseria Gonorrhea: NEGATIVE
Trichomonas: NEGATIVE

## 2022-12-13 ENCOUNTER — Ambulatory Visit
Admission: RE | Admit: 2022-12-13 | Discharge: 2022-12-13 | Disposition: A | Discharge status: Home / Self Care | Payer: 59 | Source: Ambulatory Visit | Attending: Family Medicine | Admitting: Family Medicine

## 2022-12-13 DIAGNOSIS — E041 Nontoxic single thyroid nodule: Secondary | ICD-10-CM | POA: Diagnosis not present

## 2022-12-13 DIAGNOSIS — E049 Nontoxic goiter, unspecified: Secondary | ICD-10-CM

## 2022-12-19 ENCOUNTER — Other Ambulatory Visit: Payer: Self-pay

## 2022-12-19 DIAGNOSIS — E041 Nontoxic single thyroid nodule: Secondary | ICD-10-CM

## 2022-12-21 ENCOUNTER — Ambulatory Visit: Payer: Self-pay | Admitting: Radiology

## 2022-12-27 ENCOUNTER — Encounter: Payer: Self-pay | Admitting: Radiology

## 2022-12-27 ENCOUNTER — Other Ambulatory Visit (HOSPITAL_COMMUNITY)
Admission: RE | Admit: 2022-12-27 | Discharge: 2022-12-27 | Disposition: A | Discharge status: Home / Self Care | Payer: 59 | Source: Ambulatory Visit | Attending: Radiology | Admitting: Radiology

## 2022-12-27 ENCOUNTER — Ambulatory Visit (INDEPENDENT_AMBULATORY_CARE_PROVIDER_SITE_OTHER): Payer: 59 | Admitting: Radiology

## 2022-12-27 VITALS — BP 120/74 | Ht 60.25 in | Wt 151.0 lb

## 2022-12-27 DIAGNOSIS — N841 Polyp of cervix uteri: Secondary | ICD-10-CM

## 2022-12-27 NOTE — Progress Notes (Signed)
      Subjective: Denise Blackburn is a 62 y.o. female accompanied by [facility] interpreter referred from PCP for a cervical polyp. She denies any bleeding or other symptoms.   Review of Systems  All other systems reviewed and are negative.   Past Medical History:  Diagnosis Date   Asthma    7 yrs ago.  She does not use any inhalers   GERD (gastroesophageal reflux disease)    EGD performed 15-17 yrs ago in [place]   Headache    migraines   Hypertension       Objective:  Today's Vitals   12/27/22 1028  BP: 120/74  Weight: 151 lb (68.5 kg)  Height: 5' 0.25" (1.53 m)   Body mass index is 29.25 kg/m.   -General: no acute distress -Vulva: without lesions or discharge -Vagina: no lesions or discharge present -Cervix: 25m polyp removed with ring forceps, sent to pathology, hemostasis maintained with pressure. 2 4-517mnabothian cysts present on the cervix at 10 and 11 oclock -Perineum: no lesions -Uterus: [place], non tender -Adnexa: no masses or tenderness   JoRoyal HawthornCMA chaperone  Chaperone offered and declined.  Assessment:/Plan:   1. Cervical polyp  - Surgical pathology( [place]/ [facility])    Will contact patient with results of testing completed today.May have some bleeding later today, advised to put on a pad before she leaves, avoid intercourse for the next few days. Call if any heavy bleeding.

## 2022-12-28 LAB — SURGICAL PATHOLOGY

## 2023-01-31 ENCOUNTER — Ambulatory Visit: Payer: 59

## 2023-02-07 ENCOUNTER — Ambulatory Visit: Payer: 59

## 2023-02-13 ENCOUNTER — Other Ambulatory Visit: Payer: 59

## 2023-05-25 ENCOUNTER — Other Ambulatory Visit: Payer: Self-pay | Admitting: Family Medicine

## 2023-05-25 DIAGNOSIS — I10 Essential (primary) hypertension: Secondary | ICD-10-CM

## 2023-07-19 ENCOUNTER — Inpatient Hospital Stay: Admission: RE | Admit: 2023-07-19 | Payer: 59 | Source: Ambulatory Visit

## 2023-09-13 ENCOUNTER — Other Ambulatory Visit: Payer: Self-pay | Admitting: Surgery

## 2023-09-13 DIAGNOSIS — R221 Localized swelling, mass and lump, neck: Secondary | ICD-10-CM

## 2023-09-13 DIAGNOSIS — E042 Nontoxic multinodular goiter: Secondary | ICD-10-CM | POA: Diagnosis not present

## 2023-11-15 ENCOUNTER — Other Ambulatory Visit (HOSPITAL_COMMUNITY)
Admission: RE | Admit: 2023-11-15 | Discharge: 2023-11-15 | Disposition: A | Discharge status: Home / Self Care | Payer: 59 | Source: Ambulatory Visit | Attending: Surgery | Admitting: Surgery

## 2023-11-15 ENCOUNTER — Ambulatory Visit
Admission: RE | Admit: 2023-11-15 | Discharge: 2023-11-15 | Disposition: A | Discharge status: Home / Self Care | Payer: 59 | Source: Ambulatory Visit | Attending: Surgery | Admitting: Surgery

## 2023-11-15 DIAGNOSIS — E079 Disorder of thyroid, unspecified: Secondary | ICD-10-CM | POA: Diagnosis not present

## 2023-11-15 DIAGNOSIS — R221 Localized swelling, mass and lump, neck: Secondary | ICD-10-CM

## 2023-11-15 DIAGNOSIS — R896 Abnormal cytological findings in specimens from other organs, systems and tissues: Secondary | ICD-10-CM | POA: Insufficient documentation

## 2023-11-15 DIAGNOSIS — E041 Nontoxic single thyroid nodule: Secondary | ICD-10-CM | POA: Diagnosis not present

## 2023-11-21 LAB — CYTOLOGY - NON PAP

## 2023-11-21 NOTE — Progress Notes (Signed)
Cytopathology shows atypia.  Will be sent for [facility] testing - will take about 3 weeks to get results.  Tresa Endo - please notify patient.  Darnell Level, MD [facility] practice Office: [phone]

## 2023-11-22 DIAGNOSIS — E041 Nontoxic single thyroid nodule: Secondary | ICD-10-CM | POA: Diagnosis not present

## 2023-12-01 ENCOUNTER — Other Ambulatory Visit: Payer: Self-pay

## 2023-12-01 DIAGNOSIS — I10 Essential (primary) hypertension: Secondary | ICD-10-CM

## 2023-12-01 MED ORDER — AMLODIPINE BESYLATE 2.5 MG PO TABS: 2.5000 mg | ORAL_TABLET | Freq: Every day | ORAL | 0 refills | Status: DC

## 2023-12-05 ENCOUNTER — Encounter (HOSPITAL_COMMUNITY): Payer: Self-pay

## 2023-12-05 NOTE — Progress Notes (Signed)
Good news!  The [facility] test result is benign.  No surgery is indicated at this time.  Patient should return for surgical follow up in one year with a repeat USN and TSH level as we discussed in the office.  Tresa Endo - please arrange follow up.  Darnell Level, MD [facility] practice Office: [phone]

## 2024-01-29 DIAGNOSIS — S52502A Unspecified fracture of the lower end of left radius, initial encounter for closed fracture: Secondary | ICD-10-CM | POA: Diagnosis not present

## 2024-01-29 DIAGNOSIS — S52592A Other fractures of lower end of left radius, initial encounter for closed fracture: Secondary | ICD-10-CM | POA: Diagnosis not present

## 2024-01-29 DIAGNOSIS — S66912A Strain of unspecified muscle, fascia and tendon at wrist and hand level, left hand, initial encounter: Secondary | ICD-10-CM | POA: Diagnosis not present

## 2024-01-29 DIAGNOSIS — M79632 Pain in left forearm: Secondary | ICD-10-CM | POA: Diagnosis not present

## 2024-01-29 DIAGNOSIS — M25532 Pain in left wrist: Secondary | ICD-10-CM | POA: Diagnosis not present

## 2024-01-29 DIAGNOSIS — S63502A Unspecified sprain of left wrist, initial encounter: Secondary | ICD-10-CM | POA: Diagnosis not present

## 2024-01-31 DIAGNOSIS — S52502A Unspecified fracture of the lower end of left radius, initial encounter for closed fracture: Secondary | ICD-10-CM | POA: Diagnosis not present

## 2024-01-31 DIAGNOSIS — S52602A Unspecified fracture of lower end of left ulna, initial encounter for closed fracture: Secondary | ICD-10-CM | POA: Diagnosis not present

## 2024-02-01 DIAGNOSIS — S52532A Colles' fracture of left radius, initial encounter for closed fracture: Secondary | ICD-10-CM | POA: Diagnosis not present

## 2024-02-07 ENCOUNTER — Other Ambulatory Visit: Payer: Self-pay | Admitting: Orthopedic Surgery

## 2024-02-07 ENCOUNTER — Other Ambulatory Visit: Payer: Self-pay

## 2024-02-07 ENCOUNTER — Encounter (HOSPITAL_BASED_OUTPATIENT_CLINIC_OR_DEPARTMENT_OTHER)
Admission: RE | Admit: 2024-02-07 | Discharge: 2024-02-07 | Disposition: A | Discharge status: Home / Self Care | Source: Ambulatory Visit | Attending: Orthopedic Surgery | Admitting: Orthopedic Surgery

## 2024-02-07 ENCOUNTER — Encounter (HOSPITAL_BASED_OUTPATIENT_CLINIC_OR_DEPARTMENT_OTHER): Payer: Self-pay | Admitting: Orthopedic Surgery

## 2024-02-07 ENCOUNTER — Other Ambulatory Visit: Payer: Self-pay | Admitting: Family Medicine

## 2024-02-07 DIAGNOSIS — Z01810 Encounter for preprocedural cardiovascular examination: Secondary | ICD-10-CM | POA: Insufficient documentation

## 2024-02-07 DIAGNOSIS — R519 Headache, unspecified: Secondary | ICD-10-CM | POA: Diagnosis not present

## 2024-02-07 DIAGNOSIS — K219 Gastro-esophageal reflux disease without esophagitis: Secondary | ICD-10-CM | POA: Diagnosis not present

## 2024-02-07 DIAGNOSIS — S52552A Other extraarticular fracture of lower end of left radius, initial encounter for closed fracture: Secondary | ICD-10-CM | POA: Diagnosis not present

## 2024-02-07 DIAGNOSIS — I10 Essential (primary) hypertension: Secondary | ICD-10-CM | POA: Diagnosis not present

## 2024-02-07 NOTE — H&P (Signed)
 History: CC / Reason for Visit: Left wrist injury HPI: This patient is a 63 year old RHD female who presents for evaluation of left wrist injury that occurred from a mechanical fall outside.  She presents in a short arm splint.  She was evaluated in an urgent care setting where x-rays were obtained and presents today for additional evaluation.  Pain is reasonably well controlled.  She is accompanied by a family member who also serves as an interpreter  Past medical history, past surgical history, family history, social history, medications, allergies and review of systems are thoroughly reviewed by me, signed and scanned into [facility] today.    Exam:  Vitals: Refer to EMR. Constitutional:  WD, WN, NAD HEENT:  NCAT, EOMI Neuro/Psych:  Alert & oriented to person, place, and time; appropriate mood & affect Lymphatic: No generalized UE edema or lymphadenopathy Extremities / MSK:  Both UE are normal with respect to appearance, ranges of motion, joint stability, muscle strength/tone, sensation, & perfusion except as otherwise noted:  Left wrist is in a short arm splint dressing.  The exposed digits have intact light touch sensibility in the radial, median, and are distributions with intact motor to the same.  They are well perfused and mildly swollen  Labs / Xrays:  4 views of the left wrist ordered and obtained today reveals a largely extra-articular distal radius metaphyseal fracture.  There are 16-17 of dorsal tilt and slight loss of dorsal inclination  Assessment: Displaced left distal radius fracture  Plan:  Today's findings were reviewed.  The advantages and disadvantages of continued nonoperative care versus operative care was reviewed.  After careful consideration and deliberation, they indicated that they would like to proceed operatively.  For this reason, we will not transition to a sugar tong splint and will schedule accordingly.    The details of the operative procedure were discussed with  the patient.  Questions were invited and answered.  The goal of the procedure was reviewed.  The risks of the procedure includes but is not limited to bleeding; infection; damage to the nerves or blood vessels that could result in bleeding, numbness, weakness, chronic pain, and the need for additional procedures; stiffness; the need for revision surgery; and anesthetic risks, including death. No specific outcome was guaranteed or implied.  Informed consent was obtained.   Work status: All interested parties should please consider this patient to be out of work entirely if no work is available that complies with the restrictions detailed below (if any).  If these restrictions result in the patient being out of work entirely, the employer is expected to provide documentation of such to all interested third parties such as disability insurance companies: No left-handed work

## 2024-02-12 ENCOUNTER — Ambulatory Visit (HOSPITAL_BASED_OUTPATIENT_CLINIC_OR_DEPARTMENT_OTHER)
Admission: RE | Admit: 2024-02-12 | Discharge: 2024-02-12 | Disposition: A | Discharge status: Home / Self Care | Attending: Orthopedic Surgery | Admitting: Orthopedic Surgery

## 2024-02-12 ENCOUNTER — Other Ambulatory Visit: Payer: Self-pay

## 2024-02-12 ENCOUNTER — Encounter (HOSPITAL_BASED_OUTPATIENT_CLINIC_OR_DEPARTMENT_OTHER): Payer: Self-pay | Admitting: Orthopedic Surgery

## 2024-02-12 ENCOUNTER — Ambulatory Visit (HOSPITAL_COMMUNITY)

## 2024-02-12 ENCOUNTER — Ambulatory Visit (HOSPITAL_BASED_OUTPATIENT_CLINIC_OR_DEPARTMENT_OTHER): Admitting: Anesthesiology

## 2024-02-12 ENCOUNTER — Encounter (HOSPITAL_BASED_OUTPATIENT_CLINIC_OR_DEPARTMENT_OTHER)
Admission: RE | Disposition: A | Discharge status: Home / Self Care | Payer: Self-pay | Source: Home / Self Care | Attending: Orthopedic Surgery

## 2024-02-12 DIAGNOSIS — S52502A Unspecified fracture of the lower end of left radius, initial encounter for closed fracture: Secondary | ICD-10-CM | POA: Diagnosis not present

## 2024-02-12 DIAGNOSIS — R519 Headache, unspecified: Secondary | ICD-10-CM | POA: Diagnosis not present

## 2024-02-12 DIAGNOSIS — K219 Gastro-esophageal reflux disease without esophagitis: Secondary | ICD-10-CM | POA: Insufficient documentation

## 2024-02-12 DIAGNOSIS — S6292XA Unspecified fracture of left wrist and hand, initial encounter for closed fracture: Secondary | ICD-10-CM | POA: Diagnosis not present

## 2024-02-12 DIAGNOSIS — W19XXXA Unspecified fall, initial encounter: Secondary | ICD-10-CM | POA: Diagnosis not present

## 2024-02-12 DIAGNOSIS — S52552A Other extraarticular fracture of lower end of left radius, initial encounter for closed fracture: Secondary | ICD-10-CM | POA: Insufficient documentation

## 2024-02-12 DIAGNOSIS — J45909 Unspecified asthma, uncomplicated: Secondary | ICD-10-CM | POA: Diagnosis not present

## 2024-02-12 DIAGNOSIS — I10 Essential (primary) hypertension: Secondary | ICD-10-CM | POA: Diagnosis not present

## 2024-02-12 DIAGNOSIS — T148XXA Other injury of unspecified body region, initial encounter: Secondary | ICD-10-CM

## 2024-02-12 HISTORY — PX: OPEN REDUCTION INTERNAL FIXATION (ORIF) DISTAL RADIAL FRACTURE: SHX5989

## 2024-02-12 SURGERY — OPEN REDUCTION INTERNAL FIXATION (ORIF) DISTAL RADIUS FRACTURE
Anesthesia: Monitor Anesthesia Care | Site: Arm Lower | Laterality: Left

## 2024-02-12 MED ORDER — ACETAMINOPHEN 325 MG PO TABS: 650.0000 mg | ORAL_TABLET | Freq: Four times a day (QID) | ORAL | Status: AC

## 2024-02-12 MED ORDER — ROPIVACAINE HCL 5 MG/ML IJ SOLN
INTRAMUSCULAR | Status: DC | PRN
  Administered 2024-02-12: 25 mL via PERINEURAL

## 2024-02-12 MED ORDER — AMISULPRIDE (ANTIEMETIC) 5 MG/2ML IV SOLN: 10.0000 mg | Freq: Once | INTRAVENOUS | Status: DC | PRN

## 2024-02-12 MED ORDER — DEXAMETHASONE SODIUM PHOSPHATE 10 MG/ML IJ SOLN
INTRAMUSCULAR | Status: DC | PRN
  Administered 2024-02-12: 10 mg via INTRAVENOUS

## 2024-02-12 MED ORDER — CEFAZOLIN SODIUM-DEXTROSE 2-4 GM/100ML-% IV SOLN
2.0000 g | INTRAVENOUS | Status: AC
  Administered 2024-02-12: 2 g via INTRAVENOUS

## 2024-02-12 MED ORDER — FENTANYL CITRATE (PF) 100 MCG/2ML IJ SOLN
INTRAMUSCULAR | Status: AC
  Filled 2024-02-12: qty 2

## 2024-02-12 MED ORDER — FENTANYL CITRATE (PF) 100 MCG/2ML IJ SOLN: 25.0000 ug | INTRAMUSCULAR | Status: DC | PRN

## 2024-02-12 MED ORDER — IBUPROFEN 200 MG PO TABS: 600.0000 mg | ORAL_TABLET | Freq: Four times a day (QID) | ORAL | Status: AC

## 2024-02-12 MED ORDER — PROPOFOL 500 MG/50ML IV EMUL
INTRAVENOUS | Status: DC | PRN
Start: 2024-02-12 — End: 2024-02-12
  Administered 2024-02-12: 100 ug/kg/min via INTRAVENOUS

## 2024-02-12 MED ORDER — LACTATED RINGERS IV SOLN: INTRAVENOUS | Status: DC

## 2024-02-12 MED ORDER — ACETAMINOPHEN 500 MG PO TABS: 1000.0000 mg | ORAL_TABLET | Freq: Once | ORAL | Status: DC

## 2024-02-12 MED ORDER — ACETAMINOPHEN 500 MG PO TABS
1000.0000 mg | ORAL_TABLET | Freq: Once | ORAL | Status: AC
  Administered 2024-02-12: 1000 mg via ORAL

## 2024-02-12 MED ORDER — OXYCODONE HCL 5 MG/5ML PO SOLN: 5.0000 mg | Freq: Once | ORAL | Status: DC | PRN

## 2024-02-12 MED ORDER — OXYCODONE HCL 5 MG PO TABS: 5.0000 mg | ORAL_TABLET | Freq: Once | ORAL | Status: DC | PRN

## 2024-02-12 MED ORDER — CEFAZOLIN SODIUM-DEXTROSE 2-4 GM/100ML-% IV SOLN
INTRAVENOUS | Status: AC
  Filled 2024-02-12: qty 100

## 2024-02-12 MED ORDER — 0.9 % SODIUM CHLORIDE (POUR BTL) OPTIME
TOPICAL | Status: DC | PRN
  Administered 2024-02-12: 1000 mL

## 2024-02-12 MED ORDER — ONDANSETRON HCL 4 MG/2ML IJ SOLN: 4.0000 mg | Freq: Once | INTRAMUSCULAR | Status: DC | PRN

## 2024-02-12 MED ORDER — FENTANYL CITRATE (PF) 100 MCG/2ML IJ SOLN
50.0000 ug | Freq: Once | INTRAMUSCULAR | Status: AC
  Administered 2024-02-12: 50 ug via INTRAVENOUS

## 2024-02-12 MED ORDER — MIDAZOLAM HCL 2 MG/2ML IJ SOLN
INTRAMUSCULAR | Status: AC
  Filled 2024-02-12: qty 2

## 2024-02-12 MED ORDER — ACETAMINOPHEN 500 MG PO TABS
ORAL_TABLET | ORAL | Status: AC
  Filled 2024-02-12: qty 2

## 2024-02-12 MED ORDER — MEPERIDINE HCL 25 MG/ML IJ SOLN: 6.2500 mg | INTRAMUSCULAR | Status: DC | PRN

## 2024-02-12 MED ORDER — MIDAZOLAM HCL 2 MG/2ML IJ SOLN
1.0000 mg | Freq: Once | INTRAMUSCULAR | Status: AC
  Administered 2024-02-12: 1 mg via INTRAVENOUS

## 2024-02-12 SURGICAL SUPPLY — 63 items
BAND RUBBER #18 3X1/16 STRL (MISCELLANEOUS) IMPLANT
BIT DRILL SOLID 2.0X40MM (BIT) IMPLANT
BIT DRILL SOLID 2.5X40MM (BIT) IMPLANT
BLADE HEX COATED 2.75 (ELECTRODE) ×1 IMPLANT
BLADE MINI RND TIP GREEN BEAV (BLADE) IMPLANT
BLADE SURG 15 STRL LF DISP TIS (BLADE) ×1 IMPLANT
BNDG COHESIVE 2X5 TAN ST LF (GAUZE/BANDAGES/DRESSINGS) IMPLANT
BNDG COHESIVE 4X5 TAN STRL LF (GAUZE/BANDAGES/DRESSINGS) ×1 IMPLANT
BNDG ESMARK 4X9 LF (GAUZE/BANDAGES/DRESSINGS) ×1 IMPLANT
BNDG GAUZE DERMACEA FLUFF 4 (GAUZE/BANDAGES/DRESSINGS) ×1 IMPLANT
BRUSH SCRUB EZ PLAIN DRY (MISCELLANEOUS) IMPLANT
CANISTER SUCT 1200ML W/VALVE (MISCELLANEOUS) ×1 IMPLANT
CHLORAPREP W/TINT 26 (MISCELLANEOUS) ×1 IMPLANT
CORD BIPOLAR FORCEPS 12FT (ELECTRODE) ×1 IMPLANT
COVER BACK TABLE 60X90IN (DRAPES) ×1 IMPLANT
COVER MAYO STAND STRL (DRAPES) ×1 IMPLANT
CUFF TOURN SGL QUICK 18X4 (TOURNIQUET CUFF) IMPLANT
CUFF TRNQT CYL 24X4X16.5-23 (TOURNIQUET CUFF) IMPLANT
DRAPE C-ARM 42X72 X-RAY (DRAPES) ×1 IMPLANT
DRAPE EXTREMITY T 121X128X90 (DISPOSABLE) ×1 IMPLANT
DRAPE SURG 17X23 STRL (DRAPES) ×1 IMPLANT
DRSG ADAPTIC 3X8 NADH LF (GAUZE/BANDAGES/DRESSINGS) ×1 IMPLANT
DRSG EMULSION OIL 3X3 NADH (GAUZE/BANDAGES/DRESSINGS) IMPLANT
ELECTRODE REM PT RTRN 9FT ADLT (ELECTROSURGICAL) ×1 IMPLANT
GAUZE SPONGE 4X4 12PLY STRL LF (GAUZE/BANDAGES/DRESSINGS) ×1 IMPLANT
GLOVE BIO SURGEON STRL SZ7.5 (GLOVE) ×1 IMPLANT
GLOVE BIOGEL PI IND STRL 7.0 (GLOVE) ×1 IMPLANT
GLOVE BIOGEL PI IND STRL 8 (GLOVE) ×1 IMPLANT
GLOVE ECLIPSE 6.5 STRL STRAW (GLOVE) ×1 IMPLANT
GOWN STRL REUS W/ TWL LRG LVL3 (GOWN DISPOSABLE) ×2 IMPLANT
GOWN STRL REUS W/TWL XL LVL3 (GOWN DISPOSABLE) ×1 IMPLANT
GUIDE AIMING 1.5MM (WIRE) IMPLANT
NDL HYPO 25X1 1.5 SAFETY (NEEDLE) IMPLANT
NEEDLE HYPO 25X1 1.5 SAFETY (NEEDLE) IMPLANT
NS IRRIG 1000ML POUR BTL (IV SOLUTION) ×1 IMPLANT
PACK BASIN DAY SURGERY FS (CUSTOM PROCEDURE TRAY) ×1 IMPLANT
PADDING CAST ABS COTTON 4X4 ST (CAST SUPPLIES) IMPLANT
PEG GEMINUS SMOOTH LOCK 2.0X17 (Peg) IMPLANT
PEG GEMINUS SMOOTH LOCK 2.0X19 (Peg) IMPLANT
PEG SMOOTH LOCK 2.0X16 (Screw) IMPLANT
PEG SMOOTH LOCK 2.0X18 (Screw) IMPLANT
PENCIL SMOKE EVACUATOR (MISCELLANEOUS) ×1 IMPLANT
PLATE LEFT NARROW 3H (Plate) IMPLANT
SCREW CORT LOCK 3.5X10 TI (Screw) IMPLANT
SCREW GEMINUS CORT LOCK 3.5X9 (Screw) IMPLANT
SCREW GEMINUS PALS 2.5X14 (Screw) IMPLANT
SCREW NONLOCK POLYAXIAL 3.5X11 (Screw) IMPLANT
SCREWDRIVER SURG ST 2 (INSTRUMENTS) IMPLANT
SLEEVE SCD COMPRESS KNEE MED (STOCKING) ×1 IMPLANT
SLING ARM FOAM STRAP LRG (SOFTGOODS) IMPLANT
SPLINT PLASTER CAST XFAST 3X15 (CAST SUPPLIES) IMPLANT
STOCKINETTE 6 STRL (DRAPES) ×1 IMPLANT
SUCTION TUBE FRAZIER 10FR DISP (SUCTIONS) ×1 IMPLANT
SUT VIC AB 2-0 PS2 27 (SUTURE) ×1 IMPLANT
SUT VIC AB 4-0 PS2 18 (SUTURE) IMPLANT
SUT VICRYL RAPIDE 4-0 (SUTURE) IMPLANT
SUT VICRYL RAPIDE 4/0 PS 2 (SUTURE) ×1 IMPLANT
SYR 10ML LL (SYRINGE) IMPLANT
SYR BULB EAR ULCER 3OZ GRN STR (SYRINGE) ×1 IMPLANT
TOWEL GREEN STERILE FF (TOWEL DISPOSABLE) ×1 IMPLANT
TUBE CONNECTING 20X1/4 (TUBING) ×1 IMPLANT
UNDERPAD 30X36 HEAVY ABSORB (UNDERPADS AND DIAPERS) ×1 IMPLANT
WIRE FIX 1.5 STD TIP (WIRE) IMPLANT

## 2024-02-12 NOTE — Discharge Instructions (Addendum)
 Discharge Instructions   You have a dressing with a plaster splint incorporated in it. Move your fingers as much as possible, making a full fist and fully opening the fist. Elevate your hand to reduce pain & swelling of the digits.  Ice over the operative site may be helpful to reduce pain & swelling.  DO NOT USE HEAT. Take Tylenol  650 mg and Ibuprofen  600 mg together every 6 hours. If you take Hydrocodone  additionally for pain, you must decrease the amount of Tylenol  to 325 mg.  Leave the dressing in place until you return to our office.  You may shower, but keep the bandage clean & dry.  You may drive a car when you are off of prescription pain medications and can safely control your vehicle with both hands. Our office will call you to arrange follow-up   Please call [phone] during normal business hours or [phone] after hours for any problems. Including the following:  - excessive redness of the incisions - drainage for more than 4 days - fever of more than 101.5 F  *Please note that pain medications will not be refilled after hours or on weekends.    No Tylenol  before 1:00pm.  Post Anesthesia Home Care Instructions  Activity: Get plenty of rest for the remainder of the day. A responsible individual must stay with you for 24 hours following the procedure.  For the next 24 hours, DO NOT: -Drive a car -Advertising copywriter -Drink alcoholic beverages -Take any medication unless instructed by your physician -Make any legal decisions or sign important papers.  Meals: Start with liquid foods such as gelatin or soup. Progress to regular foods as tolerated. Avoid greasy, spicy, heavy foods. If nausea and/or vomiting occur, drink only clear liquids until the nausea and/or vomiting subsides. Call your physician if vomiting continues.  Special Instructions/Symptoms: Your throat may feel dry or sore from the anesthesia or the breathing tube placed in your throat during surgery. If  this causes discomfort, gargle with warm salt water. The discomfort should disappear within 24 hours.   Regional Anesthesia Blocks  1. You may not be able to move or feel the "blocked" extremity after a regional anesthetic block. This may last may last from 3-48 hours after placement, but it will go away. The length of time depends on the medication injected and your individual response to the medication. As the nerves start to wake up, you may experience tingling as the movement and feeling returns to your extremity. If the numbness and inability to move your extremity has not gone away after 48 hours, please call your surgeon.   2. The extremity that is blocked will need to be protected until the numbness is gone and the strength has returned. Because you cannot feel it, you will need to take extra care to avoid injury. Because it may be weak, you may have difficulty moving it or using it. You may not know what position it is in without looking at it while the block is in effect.  3. For blocks in the legs and feet, returning to weight bearing and walking needs to be done carefully. You will need to wait until the numbness is entirely gone and the strength has returned. You should be able to move your leg and foot normally before you try and bear weight or walk. You will need someone to be with you when you first try to ensure you do not fall and possibly risk injury.  4. Bruising and tenderness  at the needle site are common side effects and will resolve in a few days.  5. Persistent numbness or new problems with movement should be communicated to the surgeon or the [facility] [phone] [facility] [phone]).

## 2024-02-12 NOTE — Progress Notes (Signed)
Assisted Dr. Finucane with left, supraclavicular, ultrasound guided block. Side rails up, monitors on throughout procedure. See vital signs in flow sheet. Tolerated Procedure well. 

## 2024-02-12 NOTE — Transfer of Care (Signed)
 Immediate Anesthesia Transfer of Care Note  Patient: Denise Blackburn  Procedure(s) Performed: OPEN REDUCTION INTERNAL FIXATION (ORIF) DISTAL RADIUS FRACTURE (Left: Arm Lower)  Patient Location: PACU  Anesthesia Type:MAC  Level of Consciousness: drowsy  Airway & Oxygen Therapy: Patient Spontanous Breathing  Post-op Assessment: Report given to RN and Post -op Vital signs reviewed and stable  Post vital signs: Reviewed and stable  Last Vitals:  Vitals Value Taken Time  BP 111/74 02/12/24 0950  Temp    Pulse 80 02/12/24 0953  Resp 18 02/12/24 0953  SpO2 92 % 02/12/24 0953  Vitals shown include unfiled device data.  Last Pain:  Vitals:   02/12/24 0648  TempSrc: Temporal  PainSc: 0-No pain         Complications: No notable events documented.

## 2024-02-12 NOTE — Anesthesia Procedure Notes (Signed)
 Anesthesia Regional Block: Supraclavicular block   Pre-Anesthetic Checklist: , timeout performed,  Correct Patient, Correct Site, Correct Laterality,  Correct Procedure, Correct Position, site marked,  Risks and benefits discussed,  Surgical consent,  Pre-op evaluation,  At surgeon's request and post-op pain management  Laterality: Left  Prep: Maximum Sterile Barrier Precautions used, chloraprep       Needles:  Injection technique: Single-shot  Needle Type: Echogenic Stimulator Needle     Needle Length: 9cm  Needle Gauge: 22     Additional Needles:   Procedures:,,,, ultrasound used (permanent image in chart),,    Narrative:  Start time: 02/12/2024 7:55 AM End time: 02/12/2024 8:00 AM Injection made incrementally with aspirations every 5 mL.  Performed by: Personally  Anesthesiologist: Jacquelyne Matte, DO  Additional Notes: Monitors applied. No increased pain on injection. No increased resistance to injection. Injection made in 5cc increments. Good needle visualization. Patient tolerated procedure well.

## 2024-02-12 NOTE — Op Note (Signed)
 02/12/2024  8:16 AM  PATIENT:  Denise Blackburn  63 y.o. female  PRE-OPERATIVE DIAGNOSIS:  Displaced left extra-articular distal radius fracture  POST-OPERATIVE DIAGNOSIS:  Same  PROCEDURE:  ORIF displaced left extra-articular distal radius fracture  SURGEON: Margette Sheldon. Hildy Lowers, MD  PHYSICIAN ASSISTANT: Pierre Briar, OPA-C  ANESTHESIA:  regional and MAC  SPECIMENS:  None  DRAINS: None  EBL:   Less than 10 mL  PREOPERATIVE INDICATIONS:  Denise Blackburn is a  63 y.o. female with a displaced left extra-articular distal radius fracture.  The risks benefits and alternatives were discussed with the patient preoperatively including but not limited to the risks of infection, bleeding, nerve injury, cardiopulmonary complications, the need for revision surgery, among others, and the patient verbalized understanding and consented to proceed.  OPERATIVE IMPLANTS: [employer] Geminus plate/screws/pegs  OPERATIVE PROCEDURE: After receiving prophylactic antibiotics and a regional block, the patient was escorted to the operative theatre and placed in a supine position.   A surgical "time-out" was performed during which the planned procedure, proposed operative site, and the correct patient identity were compared to the operative consent and agreement confirmed by the circulating nurse according to current facility policy. Following application of a tourniquet to the operative extremity, the exposed skin was pre-scrubbed with Hibiclens  scrub brush and then was prepped with Chloraprep and draped in the usual sterile fashion. The limb was exsanguinated with an Esmarch bandage and the tourniquet inflated to approximately higher than systolic BP.   A sinusoidal-shaped incision was marked and made over the FCR axis and the distal forearm. The skin was incised sharply with scalpel, subcutaneous tissues with blunt and spreading dissection. The FCR axis was exploited deeply. The pronator quadratus was  reflected in an L-shaped ulnarly and the brachioradialis was split in a Z-plasty fashion for later reapproximation. The fracture was inspected and provisionally reduced.  This was confirmed fluoroscopically. The appropriately sized plate was selected and found to fit well. It was placed in its provisional alignment of the radius and this was confirmed fluoroscopically.  It was secured to the radius with a screw through the slotted hole.  Additional adjustments were made as necessary, and the distal holes were all drilled and filled.  Peg/screw length distally was selected on the shorter side of measurements to minimize the risk for dorsal cortical penetration. The remainder of the proximal holes were drilled and filled.   Final images were obtained and the DRUJ was examined for stability. It was found to be sufficiently stable. The wound was then copiously irrigated and the brachioradialis repaired with 2-0 Vicryl Rapide suture followed by repair of the pronator quadratus with the same suture type. Tourniquet was released and additional hemostasis obtained and the skin was closed with 2-0 Vicryl deep dermal buried sutures followed by running 4-0 Vicryl Rapide horizontal mattress suture in the skin. A bulky dressing with a volar plaster component was applied and the patient was taken to the recovery room in stable condition.  DISPOSITION: The patient will be discharged home today with typical post-op instructions, returning in 10-15 days for reevaluation with new x-rays of the affected wrist out of the splint to include an inclined lateral and then transition to therapy to have a custom splint constructed and begin rehabilitation.

## 2024-02-12 NOTE — Anesthesia Preprocedure Evaluation (Addendum)
 Anesthesia Evaluation  Patient identified by MRN, date of birth, ID band Patient awake    Reviewed: Allergy & Precautions, H&P , NPO status , Patient's Chart, lab work & pertinent test results  Airway Mallampati: III  TM Distance: >3 FB Neck ROM: Full    Dental  (+) Teeth Intact, Dental Advisory Given   Pulmonary asthma (well controlled)    Pulmonary exam normal breath sounds clear to auscultation       Cardiovascular hypertension (115/84 preop), Pt. on medications Normal cardiovascular exam Rhythm:Regular Rate:Normal     Neuro/Psych  Headaches  negative psych ROS   GI/Hepatic Neg liver ROS,GERD  Controlled and Medicated,,  Endo/Other  negative endocrine ROS    Renal/GU negative Renal ROS  negative genitourinary   Musculoskeletal negative musculoskeletal ROS (+)    Abdominal   Peds negative pediatric ROS (+)  Hematology negative hematology ROS (+)   Anesthesia Other Findings   Reproductive/Obstetrics negative OB ROS                             Anesthesia Physical Anesthesia Plan  ASA: 2  Anesthesia Plan: MAC and Regional   Post-op Pain Management: Regional block* and Tylenol  PO (pre-op)*   Induction:   PONV Risk Score and Plan: 2 and Propofol  infusion and TIVA  Airway Management Planned: Natural Airway and Simple Face Mask  Additional Equipment: None  Intra-op Plan:   Post-operative Plan:   Informed Consent: I have reviewed the patients History and Physical, chart, labs and discussed the procedure including the risks, benefits and alternatives for the proposed anesthesia with the patient or authorized representative who has indicated his/her understanding and acceptance.       Plan Discussed with: CRNA  Anesthesia Plan Comments:        Anesthesia Quick Evaluation

## 2024-02-12 NOTE — Interval H&P Note (Signed)
 History and Physical Interval Note:  02/12/2024 8:16 AM  Denise Blackburn  has presented today for surgery, with the diagnosis of Displaced left extra-articular distal radius fracture.  The various methods of treatment have been discussed with the patient and family. After consideration of risks, benefits and other options for treatment, the patient has consented to  Procedure(s): OPEN REDUCTION INTERNAL FIXATION (ORIF) DISTAL RADIUS FRACTURE (Left) as a surgical intervention.  The patient's history has been reviewed, patient examined, no change in status, stable for surgery.  I have reviewed the patient's chart and labs.  Questions were answered to the patient's satisfaction.     Sheryl Donna

## 2024-02-12 NOTE — Anesthesia Postprocedure Evaluation (Signed)
 Anesthesia Post Note  Patient: Ok H Phaneuf  Procedure(s) Performed: OPEN REDUCTION INTERNAL FIXATION (ORIF) DISTAL RADIUS FRACTURE (Left: Arm Lower)     Patient location during evaluation: PACU Anesthesia Type: Regional and MAC Level of consciousness: awake and alert Pain management: pain level controlled Vital Signs Assessment: post-procedure vital signs reviewed and stable Respiratory status: spontaneous breathing, nonlabored ventilation, respiratory function stable and patient connected to nasal cannula oxygen Cardiovascular status: stable and blood pressure returned to baseline Postop Assessment: no apparent nausea or vomiting Anesthetic complications: no   No notable events documented.  Last Vitals:  Vitals:   02/12/24 1000 02/12/24 1015  BP: 123/76 123/81  Pulse: 74 72  Resp: 15 13  Temp:    SpO2: 94% 94%    Last Pain:  Vitals:   02/12/24 1015  TempSrc:   PainSc: 0-No pain                 Jacquelyne Matte

## 2024-02-13 ENCOUNTER — Encounter (HOSPITAL_BASED_OUTPATIENT_CLINIC_OR_DEPARTMENT_OTHER): Payer: Self-pay | Admitting: Orthopedic Surgery

## 2024-02-22 DIAGNOSIS — S52502D Unspecified fracture of the lower end of left radius, subsequent encounter for closed fracture with routine healing: Secondary | ICD-10-CM | POA: Diagnosis not present

## 2024-02-22 DIAGNOSIS — M25632 Stiffness of left wrist, not elsewhere classified: Secondary | ICD-10-CM | POA: Diagnosis not present

## 2024-02-22 DIAGNOSIS — S52552D Other extraarticular fracture of lower end of left radius, subsequent encounter for closed fracture with routine healing: Secondary | ICD-10-CM | POA: Diagnosis not present

## 2024-02-22 DIAGNOSIS — M25532 Pain in left wrist: Secondary | ICD-10-CM | POA: Diagnosis not present

## 2024-02-28 DIAGNOSIS — S52502D Unspecified fracture of the lower end of left radius, subsequent encounter for closed fracture with routine healing: Secondary | ICD-10-CM | POA: Diagnosis not present

## 2024-02-28 DIAGNOSIS — M25532 Pain in left wrist: Secondary | ICD-10-CM | POA: Diagnosis not present

## 2024-02-28 DIAGNOSIS — M25632 Stiffness of left wrist, not elsewhere classified: Secondary | ICD-10-CM | POA: Diagnosis not present

## 2024-03-01 DIAGNOSIS — M25532 Pain in left wrist: Secondary | ICD-10-CM | POA: Diagnosis not present

## 2024-03-01 DIAGNOSIS — M25632 Stiffness of left wrist, not elsewhere classified: Secondary | ICD-10-CM | POA: Diagnosis not present

## 2024-03-01 DIAGNOSIS — S52502D Unspecified fracture of the lower end of left radius, subsequent encounter for closed fracture with routine healing: Secondary | ICD-10-CM | POA: Diagnosis not present

## 2024-03-04 DIAGNOSIS — M25532 Pain in left wrist: Secondary | ICD-10-CM | POA: Diagnosis not present

## 2024-03-04 DIAGNOSIS — S52502D Unspecified fracture of the lower end of left radius, subsequent encounter for closed fracture with routine healing: Secondary | ICD-10-CM | POA: Diagnosis not present

## 2024-03-04 DIAGNOSIS — M25632 Stiffness of left wrist, not elsewhere classified: Secondary | ICD-10-CM | POA: Diagnosis not present

## 2024-03-11 DIAGNOSIS — M25532 Pain in left wrist: Secondary | ICD-10-CM | POA: Diagnosis not present

## 2024-03-11 DIAGNOSIS — M25632 Stiffness of left wrist, not elsewhere classified: Secondary | ICD-10-CM | POA: Diagnosis not present

## 2024-03-11 DIAGNOSIS — S52502D Unspecified fracture of the lower end of left radius, subsequent encounter for closed fracture with routine healing: Secondary | ICD-10-CM | POA: Diagnosis not present

## 2024-03-13 ENCOUNTER — Other Ambulatory Visit: Payer: Self-pay | Admitting: Family Medicine

## 2024-03-13 DIAGNOSIS — I10 Essential (primary) hypertension: Secondary | ICD-10-CM

## 2024-03-14 DIAGNOSIS — M25632 Stiffness of left wrist, not elsewhere classified: Secondary | ICD-10-CM | POA: Diagnosis not present

## 2024-03-14 DIAGNOSIS — S52502D Unspecified fracture of the lower end of left radius, subsequent encounter for closed fracture with routine healing: Secondary | ICD-10-CM | POA: Diagnosis not present

## 2024-03-14 DIAGNOSIS — M25532 Pain in left wrist: Secondary | ICD-10-CM | POA: Diagnosis not present

## 2024-03-25 DIAGNOSIS — S52502D Unspecified fracture of the lower end of left radius, subsequent encounter for closed fracture with routine healing: Secondary | ICD-10-CM | POA: Diagnosis not present

## 2024-03-25 DIAGNOSIS — M25532 Pain in left wrist: Secondary | ICD-10-CM | POA: Diagnosis not present

## 2024-03-25 DIAGNOSIS — M25632 Stiffness of left wrist, not elsewhere classified: Secondary | ICD-10-CM | POA: Diagnosis not present

## 2024-03-28 DIAGNOSIS — M25632 Stiffness of left wrist, not elsewhere classified: Secondary | ICD-10-CM | POA: Diagnosis not present

## 2024-03-28 DIAGNOSIS — M25532 Pain in left wrist: Secondary | ICD-10-CM | POA: Diagnosis not present

## 2024-03-28 DIAGNOSIS — S52502D Unspecified fracture of the lower end of left radius, subsequent encounter for closed fracture with routine healing: Secondary | ICD-10-CM | POA: Diagnosis not present

## 2024-04-08 DIAGNOSIS — S52502D Unspecified fracture of the lower end of left radius, subsequent encounter for closed fracture with routine healing: Secondary | ICD-10-CM | POA: Diagnosis not present

## 2024-04-08 DIAGNOSIS — M25532 Pain in left wrist: Secondary | ICD-10-CM | POA: Diagnosis not present

## 2024-04-08 DIAGNOSIS — M25632 Stiffness of left wrist, not elsewhere classified: Secondary | ICD-10-CM | POA: Diagnosis not present

## 2024-04-10 ENCOUNTER — Other Ambulatory Visit: Payer: Self-pay | Admitting: Family Medicine

## 2024-04-10 DIAGNOSIS — I10 Essential (primary) hypertension: Secondary | ICD-10-CM

## 2024-04-11 DIAGNOSIS — S52502D Unspecified fracture of the lower end of left radius, subsequent encounter for closed fracture with routine healing: Secondary | ICD-10-CM | POA: Diagnosis not present

## 2024-04-11 DIAGNOSIS — M25632 Stiffness of left wrist, not elsewhere classified: Secondary | ICD-10-CM | POA: Diagnosis not present

## 2024-04-11 DIAGNOSIS — M25532 Pain in left wrist: Secondary | ICD-10-CM | POA: Diagnosis not present

## 2024-04-15 DIAGNOSIS — S52502D Unspecified fracture of the lower end of left radius, subsequent encounter for closed fracture with routine healing: Secondary | ICD-10-CM | POA: Diagnosis not present

## 2024-04-15 DIAGNOSIS — M25632 Stiffness of left wrist, not elsewhere classified: Secondary | ICD-10-CM | POA: Diagnosis not present

## 2024-04-15 DIAGNOSIS — M25532 Pain in left wrist: Secondary | ICD-10-CM | POA: Diagnosis not present

## 2024-04-23 DIAGNOSIS — M25532 Pain in left wrist: Secondary | ICD-10-CM | POA: Diagnosis not present

## 2024-05-22 ENCOUNTER — Ambulatory Visit (INDEPENDENT_AMBULATORY_CARE_PROVIDER_SITE_OTHER): Admitting: Family Medicine

## 2024-05-22 ENCOUNTER — Encounter: Payer: Self-pay | Admitting: Family Medicine

## 2024-05-22 VITALS — BP 120/74 | HR 90 | Resp 16 | Ht 60.0 in | Wt 156.5 lb

## 2024-05-22 DIAGNOSIS — I10 Essential (primary) hypertension: Secondary | ICD-10-CM

## 2024-05-22 DIAGNOSIS — E785 Hyperlipidemia, unspecified: Secondary | ICD-10-CM | POA: Diagnosis not present

## 2024-05-22 DIAGNOSIS — Z0000 Encounter for general adult medical examination without abnormal findings: Secondary | ICD-10-CM

## 2024-05-22 DIAGNOSIS — Z1239 Encounter for other screening for malignant neoplasm of breast: Secondary | ICD-10-CM

## 2024-05-22 DIAGNOSIS — E042 Nontoxic multinodular goiter: Secondary | ICD-10-CM | POA: Diagnosis not present

## 2024-05-22 DIAGNOSIS — R7309 Other abnormal glucose: Secondary | ICD-10-CM

## 2024-05-22 DIAGNOSIS — R7401 Elevation of levels of liver transaminase levels: Secondary | ICD-10-CM

## 2024-05-22 LAB — COMPREHENSIVE METABOLIC PANEL WITH GFR
ALT: 53 U/L — ABNORMAL HIGH (ref 0–35)
AST: 42 U/L — ABNORMAL HIGH (ref 0–37)
Albumin: 4.2 g/dL (ref 3.5–5.2)
Alkaline Phosphatase: 107 U/L (ref 39–117)
BUN: 9 mg/dL (ref 6–23)
CO2: 29 meq/L (ref 19–32)
Calcium: 9.7 mg/dL (ref 8.4–10.5)
Chloride: 100 meq/L (ref 96–112)
Creatinine, Ser: 0.63 mg/dL (ref 0.40–1.20)
GFR: 94.16 mL/min (ref >60.00)
Glucose, Bld: 212 mg/dL — ABNORMAL HIGH (ref 70–99)
Potassium: 3.2 meq/L — ABNORMAL LOW (ref 3.5–5.1)
Sodium: 138 meq/L (ref 135–145)
Total Bilirubin: 0.4 mg/dL (ref 0.2–1.2)
Total Protein: 7.8 g/dL (ref 6.0–8.3)

## 2024-05-22 LAB — LIPID PANEL
Cholesterol: 226 mg/dL — ABNORMAL HIGH (ref 0–200)
HDL: 64.6 mg/dL (ref >39.00)
LDL Cholesterol: 137 mg/dL — ABNORMAL HIGH (ref 0–99)
NonHDL: 161.24
Total CHOL/HDL Ratio: 3
Triglycerides: 121 mg/dL (ref 0.0–149.0)
VLDL: 24.2 mg/dL (ref 0.0–40.0)

## 2024-05-22 LAB — TSH: TSH: 1.44 u[IU]/mL (ref 0.35–5.50)

## 2024-05-22 LAB — HEMOGLOBIN A1C: Hgb A1c MFr Bld: 6.2 % (ref 4.6–6.5)

## 2024-05-22 MED ORDER — AMLODIPINE BESYLATE 2.5 MG PO TABS: 2.5000 mg | ORAL_TABLET | Freq: Every day | ORAL | 3 refills | Status: AC

## 2024-05-22 NOTE — Patient Instructions (Addendum)
 A few things to remember from today's visit:  Routine general medical examination at a health care facility  Hyperlipidemia, unspecified hyperlipidemia type - Plan: Comprehensive metabolic panel with GFR, Lipid panel  Primary hypertension - Plan: amLODipine  (NORVASC ) 2.5 MG tablet  Encounter for screening for malignant neoplasm of breast, unspecified screening modality - Plan: MM 3D SCREENING MAMMOGRAM BILATERAL BREAST  Multinodular thyroid  - Plan: TSH  Elevated glucose level - Plan: Hemoglobin A1c

## 2024-05-22 NOTE — Progress Notes (Signed)
 HPI: [name], accompanied by her daughter, is a 63 y.o. female with a PMHx significant for GERD, HTN, HLD, abnormal LFT's, and headaches, who is here today for her routine physical.  Last CPE: 11/22/2022  Exercise: not lately, usually walks in the morning Diet: mainly consists of home-cooked meals, but she does not consume vegetables daily Sleep: 4-5 hours nightly, having trouble sleeping Alcohol Use: none Smoking: never Vision: not established Dental: not established  Discussed the use of AI scribe software for clinical note transcription with the patient, who gave verbal consent to proceed.  History of Present Illness Chronic medical problems:   Thyroid  Nodules: 12/13/2022 Thyroid  US :  1. Enlarged, heterogeneous and multinodular thyroid  gland. 2. Approximately 3.4 cm TI-RADS category 3 nodule versus pseudo nodule in the left lower gland meets criteria to consider fine-needle aspiration biopsy. Biopsy done 11/15/2023, evaluated by a surgeon and confirmed to be benign with annual monitoring suggested for potential growth.    Hypertension:  Medications: Amlodipine  2.5 mg daily - has not taken her amlodipine  5 mg for two weeks due to a lack of refills. Due to her not having Amlodipine  on hand, she has been monitoring her diet to manage her blood pressure.  Interestingly, she says that while taking Amlodipine  she tends to sleep better, so thinks her poor sleep may be related to her HTN. BP readings at home: none Side effects: none mentioned.  No chest pain, difficulty breathing, palpitations, unusual or severe headache, visual changes, exertional chest pain, dyspnea, focal weakness, or edema. BP Readings from Last 3 Encounters:  05/22/24 120/74  02/12/24 113/76  12/27/22 120/74   Lab Results  Component Value Date   CREATININE 0.60 11/22/2022   BUN 9 11/22/2022   NA 140 11/22/2022   K 3.5 11/22/2022   CL 104 11/22/2022   CO2 26 11/22/2022      Hyperlipidemia: Currently not on pharmacological treatment Side effects from medication: none mentioned Lab Results  Component Value Date   CHOL 213 (H) 11/22/2022   HDL 61.90 11/22/2022   LDLCALC 131 (H) 11/22/2022   LDLDIRECT 146.7 06/21/2012   TRIG 97.0 11/22/2022   CHOLHDL 3 11/22/2022      Her last mammogram was in 2021, and she had a colonoscopy in May 2022.   Acute Concerns:   Left Arm Fracture: In April, she experienced a fall resulting in a left distal radius fracture, which required ORIF to repair.  Completed physical therapy in June and is scheduled for a follow-up with the orthopedic doctor next week.    Says that she experiences dizziness when lying down at night to go to sleep, but not during the day.  Immunization History  Administered Date(s) Administered   Tdap 04/21/2020   Health Maintenance  Topic Date Due   MAMMOGRAM  06/16/2022   COVID-19 Vaccine (1 - 2024-25 season) Never done   Zoster Vaccines- Shingrix (1 of 2) 08/22/2024 (Originally 08/02/2010)   INFLUENZA VACCINE  05/24/2024   Cervical Cancer Screening (HPV/Pap Cotest)  11/23/2027   Colonoscopy  03/19/2028   DTaP/Tdap/Td (2 - Td or Tdap) 04/21/2030   Hepatitis C Screening  Completed   Hepatitis B Vaccines  Aged Out   HPV VACCINES  Aged Out   Meningococcal B Vaccine  Aged Out   HIV Screening  Discontinued   Review of Systems  Constitutional:  Negative for activity change, appetite change and fever.  HENT:  Negative for hearing loss, mouth sores, sore throat and trouble swallowing.  Eyes:  Negative for redness and visual disturbance.  Respiratory:  Negative for cough, shortness of breath and wheezing.   Cardiovascular:  Negative for chest pain and leg swelling.  Gastrointestinal:  Negative for abdominal pain, nausea and vomiting.       No changes in bowel habits.  Endocrine: Negative for cold intolerance, heat intolerance, polydipsia, polyphagia and polyuria.  Genitourinary:   Negative for decreased urine volume, dysuria, hematuria, vaginal bleeding and vaginal discharge.  Musculoskeletal:  Negative for gait problem and myalgias.  Skin:  Negative for color change and rash.  Allergic/Immunologic: Negative for environmental allergies.  Neurological:  Positive for dizziness (while laying down at night). Negative for seizures, syncope, weakness and headaches.  Hematological:  Negative for adenopathy. Does not bruise/bleed easily.  Psychiatric/Behavioral:  Negative for confusion. The patient is not nervous/anxious.   All other systems reviewed and are negative.   Current Outpatient Medications on File Prior to Visit  Medication Sig Dispense Refill   acetaminophen  (TYLENOL ) 325 MG tablet Take 2 tablets (650 mg total) by mouth every 6 (six) hours.     ibuprofen  (ADVIL ) 200 MG tablet Take 3 tablets (600 mg total) by mouth every 6 (six) hours.     No current facility-administered medications on file prior to visit.    Past Medical History:  Diagnosis Date   Asthma    7 yrs ago.  She does not use any inhalers   GERD (gastroesophageal reflux disease)    EGD performed 15-17 yrs ago in [place]   Headache    migraines   Hypertension     Past Surgical History:  Procedure Laterality Date   CHOLECYSTECTOMY N/A 10/26/2017   Procedure: LAPAROSCOPIC CHOLECYSTECTOMY;  Surgeon: Sebastian Moles, MD;  Location: [facility];  Service: General;  Laterality: N/A;   OPEN REDUCTION INTERNAL FIXATION (ORIF) DISTAL RADIAL FRACTURE Left 02/12/2024   Procedure: OPEN REDUCTION INTERNAL FIXATION (ORIF) DISTAL RADIUS FRACTURE;  Surgeon: Sebastian Lenis, MD;  Location: [facility];  Service: Orthopedics;  Laterality: Left;    No Known Allergies  Family History  Problem Relation Age of Onset   Colon cancer Neg Hx    Esophageal cancer Neg Hx    Rectal cancer Neg Hx    Prostate cancer Neg Hx     Social History   Socioeconomic History   Marital status: Married    Spouse  name: Not on file   Number of children: Not on file   Years of education: Not on file   Highest education level: Not on file  Occupational History   Not on file  Tobacco Use   Smoking status: Never    Passive exposure: Never   Smokeless tobacco: Never  Vaping Use   Vaping status: Never Used  Substance and Sexual Activity   Alcohol use: No   Drug use: No   Sexual activity: Yes    Partners: Male    Birth control/protection: Post-menopausal  Other Topics Concern   Not on file  Social History Narrative   Housewife   Married 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  Wt Readings  from Last 3 Encounters:  05/22/24 156 lb 8 oz (71 kg)  02/12/24 150 lb 2.1 oz (68.1 kg)  12/27/22 151 lb (68.5 kg)    Physical Exam Vitals and nursing note reviewed.  Constitutional:      General: She is not in acute distress.    Appearance: She is well-developed.  HENT:     Head: Normocephalic and atraumatic.     Right Ear: Hearing, tympanic membrane, ear canal and external ear normal.     Left Ear: Hearing, tympanic membrane, ear canal and external ear normal.     Mouth/Throat:     Mouth: Mucous membranes are moist.     Pharynx: Oropharynx is clear. Uvula midline.  Eyes:     Extraocular Movements: Extraocular movements intact.     Conjunctiva/sclera: Conjunctivae normal.     Pupils: Pupils are equal, round, and reactive to light.  Neck:     Thyroid : No thyromegaly.     Trachea: No tracheal deviation.  Cardiovascular:     Rate and Rhythm: Normal rate and regular rhythm.     Pulses:          Dorsalis pedis pulses are 2+ on the right side and 2+ on the left side.       Posterior  tibial pulses are 2+ on the right side and 2+ on the left side.     Heart sounds: No murmur heard. Pulmonary:     Effort: Pulmonary effort is normal. No respiratory distress.     Breath sounds: Normal breath sounds.  Abdominal:     Palpations: Abdomen is soft. There is no hepatomegaly or mass.     Tenderness: There is no abdominal tenderness.  Genitourinary:       Comments: Deferred to gyn. Musculoskeletal:     Comments: No major deformity or signs of synovitis appreciated.  Lymphadenopathy:     Cervical: No cervical adenopathy.     Upper Body:     Right upper body: No supraclavicular adenopathy.     Left upper body: No supraclavicular adenopathy.  Skin:    General: Skin is warm.     Findings: No erythema or rash.  Neurological:     General: No focal deficit present.     Mental Status: She is alert and oriented to person, place, and time.     Cranial Nerves: No cranial nerve deficit.     Coordination: Coordination normal.     Gait: Gait normal.     Deep Tendon Reflexes:     Reflex Scores:      Bicep reflexes are 2+ on the right side and 2+ on the left side.      Patellar reflexes are 2+ on the right side and 2+ on the left side. Psychiatric:        Mood and Affect: Mood and affect normal.     Comments: Well groomed, good eye contact.     ASSESSMENT AND PLAN: Denise Blackburn was seen here today for her annual physical examination.  Orders Placed This Encounter  Procedures   MM 3D SCREENING MAMMOGRAM BILATERAL BREAST   Comprehensive metabolic panel with GFR   Hemoglobin A1c   Lipid panel   TSH    Routine general medical examination at a health care facility  Hyperlipidemia, unspecified hyperlipidemia type -     Comprehensive metabolic panel with GFR; Future -     Lipid panel; Future  Primary hypertension -     amLODIPine  Besylate; Take 1 tablet (  2.5 mg total) by mouth daily. Due for follow up/physical  Dispense: 90 tablet; Refill: 3  Encounter for screening  for malignant neoplasm of breast, unspecified screening modality -     3D Screening Mammogram, Left and Right; Future  Multinodular thyroid  -     TSH; Future  Elevated glucose level -     Hemoglobin A1c; Future    Return in 1 year (on 05/22/2025) for CPE, Labs, chronic problems. I,Emily Lagle,acting as a Neurosurgeon for Ricard Faulkner [place], MD.,have documented all relevant documentation on the behalf of Ayelen Sciortino [place], MD,as directed by  Alya Smaltz [place], MD while in the presence of Lariah Fleer [place], MD.  *** (refresh reminder)  I, Brittnay Pigman [place], MD, have reviewed all documentation for this visit. The documentation on 05/22/24 for the exam, diagnosis, procedures, and orders are all accurate and complete. Reynol Arnone G. [place], MD  [facility]. [place] office.

## 2024-05-23 ENCOUNTER — Ambulatory Visit: Payer: Self-pay | Admitting: Family Medicine

## 2024-05-23 DIAGNOSIS — E876 Hypokalemia: Secondary | ICD-10-CM

## 2024-05-23 DIAGNOSIS — E1169 Type 2 diabetes mellitus with other specified complication: Secondary | ICD-10-CM

## 2024-05-23 MED ORDER — POTASSIUM CHLORIDE CRYS ER 20 MEQ PO TBCR: 20.0000 meq | EXTENDED_RELEASE_TABLET | Freq: Every day | ORAL | 0 refills | Status: AC

## 2024-05-23 NOTE — Assessment & Plan Note (Addendum)
 Stable. S/P FNA/Bx and benign. According to daughter annual f/u with surgeon was recommended. TSH ordered today.

## 2024-05-23 NOTE — Assessment & Plan Note (Addendum)
Non pharmacologic treatment to continue for now. Further recommendations will be given according to 10 years CVD risk score and lipid panel numbers.  

## 2024-05-23 NOTE — Assessment & Plan Note (Signed)
 Today BP adequately controlled. She has not taken her medication for 2 weeks and not checking BP at home. Resume Amlodipine  2.5 mg daily, instructed to take medication as recommended. Monitor BP at home. Continue low salt diet. As far as problem is stable , annual follow up can be continued.

## 2024-05-23 NOTE — Assessment & Plan Note (Signed)
 We discussed the importance of regular physical activity and healthy diet for prevention of chronic illness and/or complications. Preventive guidelines reviewed. Vaccination: Declined Prevnar 20. Also due for shingrix, not interested in vaccination. Ca++ and vit D supplementation recommended. She is overdue for mammogram, order placed. Pap smear 11/2022 negative. Next CPE in a year.

## 2024-05-29 ENCOUNTER — Ambulatory Visit
Admission: RE | Admit: 2024-05-29 | Discharge: 2024-05-29 | Disposition: A | Discharge status: Home / Self Care | Source: Ambulatory Visit | Attending: Family Medicine | Admitting: Family Medicine

## 2024-05-29 DIAGNOSIS — R7401 Elevation of levels of liver transaminase levels: Secondary | ICD-10-CM | POA: Diagnosis not present

## 2024-05-30 DIAGNOSIS — M25532 Pain in left wrist: Secondary | ICD-10-CM | POA: Diagnosis not present

## 2024-07-01 ENCOUNTER — Ambulatory Visit

## 2024-07-10 ENCOUNTER — Ambulatory Visit

## 2024-07-22 ENCOUNTER — Ambulatory Visit
Admission: RE | Admit: 2024-07-22 | Discharge: 2024-07-22 | Disposition: A | Source: Ambulatory Visit | Attending: Family Medicine | Admitting: Family Medicine

## 2024-07-22 DIAGNOSIS — Z1231 Encounter for screening mammogram for malignant neoplasm of breast: Secondary | ICD-10-CM | POA: Diagnosis not present

## 2024-07-22 DIAGNOSIS — Z1239 Encounter for other screening for malignant neoplasm of breast: Secondary | ICD-10-CM

## 2024-08-26 ENCOUNTER — Other Ambulatory Visit (INDEPENDENT_AMBULATORY_CARE_PROVIDER_SITE_OTHER)

## 2024-08-26 ENCOUNTER — Other Ambulatory Visit

## 2024-08-26 DIAGNOSIS — R7401 Elevation of levels of liver transaminase levels: Secondary | ICD-10-CM | POA: Diagnosis not present

## 2024-08-26 DIAGNOSIS — R7309 Other abnormal glucose: Secondary | ICD-10-CM | POA: Diagnosis not present

## 2024-08-26 LAB — HEPATIC FUNCTION PANEL
ALT: 80 U/L — ABNORMAL HIGH (ref 0–35)
AST: 49 U/L — ABNORMAL HIGH (ref 0–37)
Albumin: 4 g/dL (ref 3.5–5.2)
Alkaline Phosphatase: 108 U/L (ref 39–117)
Bilirubin, Direct: 0.1 mg/dL (ref 0.0–0.3)
Total Bilirubin: 0.5 mg/dL (ref 0.2–1.2)
Total Protein: 7.3 g/dL (ref 6.0–8.3)

## 2024-08-26 LAB — CBC
HCT: 41.1 % (ref 36.0–46.0)
Hemoglobin: 13.7 g/dL (ref 12.0–15.0)
MCHC: 33.3 g/dL (ref 30.0–36.0)
MCV: 82.5 fl (ref 78.0–100.0)
Platelets: 244 K/uL (ref 150.0–400.0)
RBC: 4.98 Mil/uL (ref 3.87–5.11)
RDW: 13.1 % (ref 11.5–15.5)
WBC: 5.1 K/uL (ref 4.0–10.5)

## 2024-08-26 LAB — HEMOGLOBIN A1C: Hgb A1c MFr Bld: 6 % (ref 4.6–6.5)

## 2024-08-26 LAB — GLUCOSE, RANDOM: Glucose, Bld: 205 mg/dL — ABNORMAL HIGH (ref 70–99)

## 2024-08-27 LAB — HEPATITIS B SURFACE ANTIBODY,QUALITATIVE: Hep B S Ab: REACTIVE — AB

## 2024-08-27 LAB — HEPATITIS B SURFACE ANTIGEN: Hepatitis B Surface Ag: NONREACTIVE

## 2024-09-30 NOTE — Addendum Note (Signed)
 Addended by: Adolf Ormiston G on: 09/30/2024 07:52 AM   Modules accepted: Orders

## 2024-10-01 NOTE — Telephone Encounter (Signed)
 Called the interpreter, they were unsuccessful in reaching the patient so they [facility].

## 2024-10-02 NOTE — Telephone Encounter (Signed)
 Called the interpreter, they were unsuccessful in reaching the patient so they [facility].  x2

## 2024-10-02 NOTE — Telephone Encounter (Signed)
 Called her daughter earlier and LVM - Patients daughter called me back and I explained to her, her mothers labs results. Patients daughter voiced understanding. I gave the patients daughter the number to the nutritions office for her new dx for DM.

## 2024-10-23 ENCOUNTER — Encounter: Admitting: Skilled Nursing Facility1

## 2024-10-23 ENCOUNTER — Encounter: Payer: Self-pay | Admitting: Skilled Nursing Facility1

## 2024-10-23 VITALS — Ht 61.0 in | Wt 154.0 lb

## 2024-10-23 DIAGNOSIS — E119 Type 2 diabetes mellitus without complications: Secondary | ICD-10-CM | POA: Diagnosis present

## 2024-10-23 NOTE — Progress Notes (Signed)
 Glucose reading: 205  A1C 6.2  [facility] with [facility]  Pt Arrived with her supportive daughter.   Pt states she does not eat beef but does eat pork and chicken sometimes in the week.  Pt states she wakes about 7-8am starts eating around 11am. Pt stats she spends her day watching her grandchild. Pt Complains of tooth pain: Dietitian advised pt to make an appt with a dentist.  Diabetes Self-Management Education  Visit Type: First/Initial  Appt. Start Time: 8:07 Appt. End Time: 9:15  10/23/2024  Denise Blackburn, identified by name and date of birth, is a 63 y.o. female with a diagnosis of Diabetes: Type 2.   ASSESSMENT  Height 5' 1 (1.549 m), weight 154 lb (69.9 kg), last menstrual period 04/07/2018. Body mass index is 29.1 kg/m.   Diabetes Self-Management Education - 10/23/24 0814       Visit Information   Visit Type First/Initial      Initial Visit   Diabetes Type Type 2    Are you currently following a meal plan? No    Are you taking your medications as prescribed? Not on Medications      Health Coping   How would you rate your overall health? Good      Psychosocial Assessment   Patient Belief/Attitude about Diabetes Motivated to manage diabetes    What is the hardest part about your diabetes right now, causing you the most concern, or is the most worrisome to you about your diabetes?   Making healty food and beverage choices;Being active    Self-care barriers None    Self-management support Family    Other persons present Interpreter;Family Member    Patient Concerns Nutrition/Meal planning;Healthy Lifestyle    Special Needs None    Preferred Learning Style Hands on;Visual    Learning Readiness Ready    How often do you need to have someone help you when you read instructions, pamphlets, or other written materials from your doctor or pharmacy? 5 - Always      Pre-Education Assessment   Patient understands the diabetes disease and treatment process. Needs  Instruction    Patient understands incorporating nutritional management into lifestyle. Needs Instruction    Patient undertands incorporating physical activity into lifestyle. Needs Instruction    Patient understands using medications safely. Needs Instruction    Patient understands monitoring blood glucose, interpreting and using results Needs Instruction    Patient understands prevention, detection, and treatment of acute complications. Needs Instruction    Patient understands prevention, detection, and treatment of chronic complications. Needs Instruction    Patient understands how to develop strategies to address psychosocial issues. Needs Instruction    Patient understands how to develop strategies to promote health/change behavior. Needs Instruction      Complications   Last HgB A1C per patient/outside source 6.2 %    How often do you check your blood sugar? 0 times/day (not testing)    Have you had a dilated eye exam in the past 12 months? No    Have you had a dental exam in the past 12 months? No    Are you checking your feet? No      Dietary Intake   Breakfast hot lemon water    Snack (morning) 11am: rice + vegetable    Dinner 5-7pm: rice and vegetable and fish or pork meatballs + fish sauce    Beverage(s) water, hot lemon water      Activity / Exercise  Activity / Exercise Type ADL's    How many days per week do you exercise? 0    How many minutes per day do you exercise? 0    Total minutes per week of exercise 0      Patient Education   Previous Diabetes Education No    Disease Pathophysiology Definition of diabetes, type 1 and 2, and the diagnosis of diabetes;Factors that contribute to the development of diabetes    Healthy Eating Role of diet in the treatment of diabetes and the relationship between the three main macronutrients and blood glucose level;Plate Method;Reviewed blood glucose goals for pre and post meals and how to evaluate the patients' food intake on their  blood glucose level.;Meal options for control of blood glucose level and chronic complications.    Being Active Role of exercise on diabetes management, blood pressure control and cardiac health.;Helped patient identify appropriate exercises in relation to his/her diabetes, diabetes complications and other health issue.    Acute complications Taught prevention, symptoms, and  treatment of hypoglycemia - the 15 rule.    Chronic complications Relationship between chronic complications and blood glucose control;Dental care;Retinopathy and reason for yearly dilated eye exams;Nephropathy, what it is, prevention of, the use of ACE, ARB's and early detection of through urine microalbumia.;Lipid levels, blood glucose control and heart disease;Identified and discussed with patient  current chronic complications    Diabetes Stress and Support Worked with patient to identify barriers to care and solutions;Role of stress on diabetes;Identified and addressed patients feelings and concerns about diabetes      Individualized Goals (developed by patient)   Nutrition Follow meal plan discussed;General guidelines for healthy choices and portions discussed    Physical Activity Exercise 5-7 days per week;30 minutes per day    Medications Not Applicable    Problem Solving Eating Pattern    Reducing Risk do foot checks daily;treat hypoglycemia with 15 grams of carbs if blood glucose less than 70mg /dL      Post-Education Assessment   Patient understands the diabetes disease and treatment process. Demonstrates understanding / competency    Patient understands incorporating nutritional management into lifestyle. Demonstrates understanding / competency    Patient undertands incorporating physical activity into lifestyle. Demonstrates understanding / competency    Patient understands using medications safely. Demonstrates understanding / competency    Patient understands monitoring blood glucose, interpreting and using  results Demonstrates understanding / competency    Patient understands prevention, detection, and treatment of acute complications. Demonstrates understanding / competency    Patient understands prevention, detection, and treatment of chronic complications. Demonstrates understanding / competency    Patient understands how to develop strategies to address psychosocial issues. Demonstrates understanding / competency    Patient understands how to develop strategies to promote health/change behavior. Demonstrates understanding / competency      Outcomes   Expected Outcomes Demonstrated interest in learning. Expect positive outcomes    Future DMSE Yearly    Program Status Completed          Individualized Plan for Diabetes Self-Management Training:   Learning Objective:  Patient will have a greater understanding of diabetes self-management. Patient education plan is to attend individual and/or group sessions per assessed needs and concerns.    Expected Outcomes:  Demonstrated interest in learning. Expect positive outcomes  Education material provided: ADA - How to Thrive: A Guide for Your Journey with Diabetes, Food label handouts, My Plate, and Snack sheet  Materials given in [employer]  If problems or  questions, patient to contact team via:  Phone and Email  Future DSME appointment: Yearly

## 2025-01-22 ENCOUNTER — Ambulatory Visit: Admitting: Family Medicine
